# Patient Record
Sex: Male | Born: 1950 | Race: White | Hispanic: No | Marital: Single | Smoking: Former smoker
Health system: Southern US, Community
[De-identification: ages and names within clinical notes are randomized; demographics above are authoritative.]

---

## 2012-05-15 ENCOUNTER — Encounter (HOSPITAL_BASED_OUTPATIENT_CLINIC_OR_DEPARTMENT_OTHER): Payer: Self-pay | Admitting: *Deleted

## 2012-05-15 ENCOUNTER — Emergency Department (HOSPITAL_BASED_OUTPATIENT_CLINIC_OR_DEPARTMENT_OTHER): Payer: No Typology Code available for payment source

## 2012-05-15 ENCOUNTER — Emergency Department (HOSPITAL_BASED_OUTPATIENT_CLINIC_OR_DEPARTMENT_OTHER)
Admission: EM | Admit: 2012-05-15 | Discharge: 2012-05-15 | Disposition: A | Discharge status: Home / Self Care | Payer: No Typology Code available for payment source | Attending: Emergency Medicine | Admitting: Emergency Medicine

## 2012-05-15 DIAGNOSIS — J3489 Other specified disorders of nose and nasal sinuses: Secondary | ICD-10-CM | POA: Insufficient documentation

## 2012-05-15 DIAGNOSIS — M2550 Pain in unspecified joint: Secondary | ICD-10-CM | POA: Insufficient documentation

## 2012-05-15 DIAGNOSIS — R509 Fever, unspecified: Secondary | ICD-10-CM | POA: Insufficient documentation

## 2012-05-15 DIAGNOSIS — J159 Unspecified bacterial pneumonia: Secondary | ICD-10-CM | POA: Insufficient documentation

## 2012-05-15 DIAGNOSIS — IMO0001 Reserved for inherently not codable concepts without codable children: Secondary | ICD-10-CM | POA: Insufficient documentation

## 2012-05-15 DIAGNOSIS — J111 Influenza due to unidentified influenza virus with other respiratory manifestations: Secondary | ICD-10-CM

## 2012-05-15 DIAGNOSIS — R42 Dizziness and giddiness: Secondary | ICD-10-CM | POA: Insufficient documentation

## 2012-05-15 DIAGNOSIS — J189 Pneumonia, unspecified organism: Secondary | ICD-10-CM

## 2012-05-15 DIAGNOSIS — R5381 Other malaise: Secondary | ICD-10-CM | POA: Insufficient documentation

## 2012-05-15 DIAGNOSIS — R062 Wheezing: Secondary | ICD-10-CM | POA: Insufficient documentation

## 2012-05-15 DIAGNOSIS — J029 Acute pharyngitis, unspecified: Secondary | ICD-10-CM | POA: Insufficient documentation

## 2012-05-15 DIAGNOSIS — R112 Nausea with vomiting, unspecified: Secondary | ICD-10-CM | POA: Insufficient documentation

## 2012-05-15 DIAGNOSIS — R51 Headache: Secondary | ICD-10-CM | POA: Insufficient documentation

## 2012-05-15 MED ORDER — AZITHROMYCIN 250 MG PO TABS: ORAL_TABLET | ORAL | Status: DC

## 2012-05-15 NOTE — ED Notes (Signed)
Cough, chills onset Friday. Dizzy at times.

## 2012-05-15 NOTE — ED Provider Notes (Signed)
I  reviewed the resident's note and I agree with the findings and plan.      Nelia Shi, MD 05/15/12 [phone]

## 2012-05-15 NOTE — ED Provider Notes (Signed)
History     CSN: [id number]  Arrival date & time 05/15/12  1357   First MD Initiated Contact with Patient 05/15/12 1419      Chief Complaint  Patient presents with  . Cough    HPI Patient is a 62 yo M presenting to ED with 2 day history of cough of chills, getting progressively worse. He states his symptoms first started with diffuse body aches, then he developed cough, dizziness, fever, N/V. He did not have a flu shot this year. His co-worker was sick last week as well with similar symptoms. He has been drinking plenty of water and taking NSAIDs but nothing makes him feel better. He denies numbness, tingling or syncope.  History reviewed. No pertinent past medical history.  History reviewed. No pertinent past surgical history.  History reviewed. No pertinent family history.  History  Substance Use Topics  . Smoking status: Never Smoker   . Smokeless tobacco: Not on file  . Alcohol Use: Yes    Review of Systems  Constitutional: Positive for fever, chills, activity change and fatigue.  HENT: Positive for congestion and sore throat. Negative for neck pain and neck stiffness.   Eyes: Negative for visual disturbance.  Respiratory: Positive for cough and wheezing. Negative for shortness of breath.   Cardiovascular: Negative for chest pain.  Gastrointestinal: Positive for nausea and vomiting. Negative for abdominal pain.  Genitourinary: Negative for dysuria.  Musculoskeletal: Positive for myalgias and arthralgias.  Skin: Negative for rash.  Neurological: Positive for dizziness and headaches.  All other systems reviewed and are negative.    Allergies  Review of patient's allergies indicates no known allergies.  Home Medications   Current Outpatient Rx  Name  Route  Sig  Dispense  Refill  . AZITHROMYCIN 250 MG PO TABS      Take 2 tablets today, followed by one tablet per day for the following 4 days   6 each   0     BP 133/74  Pulse 112  Temp 99.3 F (37.4 C)  (Oral)  Resp 20  Ht 6\' 1"  (1.854 m)  Wt 185 lb (83.915 kg)  BMI 24.41 kg/m2  SpO2 96%  Physical Exam  Constitutional: He is oriented to person, place, and time. He appears well-developed and well-nourished. No distress.  HENT:  Head: Normocephalic and atraumatic.  Right Ear: External ear normal.  Left Ear: External ear normal.  Nose: Nose normal.  Mouth/Throat: Mucous membranes are dry. Posterior oropharyngeal edema and posterior oropharyngeal erythema present.  Eyes: Pupils are equal, round, and reactive to light.  Neck: Normal range of motion.  Pulmonary/Chest: Effort normal. He has wheezes. He has rales. He exhibits no tenderness.  Abdominal: Soft. There is no tenderness.  Musculoskeletal: Normal range of motion. He exhibits no edema.  Lymphadenopathy:    He has no cervical adenopathy.  Neurological: He is alert and oriented to person, place, and time. No cranial nerve deficit.  Skin: Skin is warm and dry. No rash noted.    ED Course  Procedures (including critical care time)  Labs Reviewed - No data to display Dg Chest 2 View  05/15/2012  *RADIOLOGY REPORT*  Clinical Data: Cough, chills.  CHEST - 2 VIEW  Comparison: None.  Findings: Lungs are hyperinflated.  There is patchy atelectasis , scarring, or infiltrate in the lateral basal segment right lower lobe.  Left lung clear.  Heart size normal.  No effusion.  Regional bones unremarkable.  IMPRESSION:  1.  Focal infiltrate, scarring  or atelectasis laterally at the right lung base.   Original Report Authenticated By: D. Andria Rhein, MD      1. Influenza   2. Community acquired pneumonia      MDM  62 yo M with flu-like illness  Patient with signs and symptoms of influenza. Will treat symptomatically with Tylenol and OTC cold medications. Discussed pros/cons of Tamiflu, which was not prescribed. He should rest and drink plenty of fluids. Also, his CXR showed haziness on right lung base and pneumonia cannot be excluded. Will  treated as community acquired with Z-pack.  Will give work note to return to work on 05/19/12. If not improved by then, he should return for re-evaluation.    Hilarie Fredrickson, MD 05/15/12 [phone]

## 2015-07-29 ENCOUNTER — Encounter (HOSPITAL_BASED_OUTPATIENT_CLINIC_OR_DEPARTMENT_OTHER): Payer: Self-pay

## 2015-07-29 ENCOUNTER — Observation Stay (HOSPITAL_COMMUNITY): Payer: No Typology Code available for payment source

## 2015-07-29 ENCOUNTER — Emergency Department (HOSPITAL_BASED_OUTPATIENT_CLINIC_OR_DEPARTMENT_OTHER): Payer: No Typology Code available for payment source

## 2015-07-29 ENCOUNTER — Observation Stay (HOSPITAL_BASED_OUTPATIENT_CLINIC_OR_DEPARTMENT_OTHER)
Admission: EM | Admit: 2015-07-29 | Discharge: 2015-07-31 | Disposition: A | Discharge status: Home / Self Care | Payer: No Typology Code available for payment source | Attending: Internal Medicine | Admitting: Internal Medicine

## 2015-07-29 DIAGNOSIS — F17220 Nicotine dependence, chewing tobacco, uncomplicated: Secondary | ICD-10-CM | POA: Insufficient documentation

## 2015-07-29 DIAGNOSIS — I639 Cerebral infarction, unspecified: Secondary | ICD-10-CM | POA: Diagnosis present

## 2015-07-29 DIAGNOSIS — Z79899 Other long term (current) drug therapy: Secondary | ICD-10-CM | POA: Insufficient documentation

## 2015-07-29 DIAGNOSIS — R29898 Other symptoms and signs involving the musculoskeletal system: Secondary | ICD-10-CM

## 2015-07-29 DIAGNOSIS — I10 Essential (primary) hypertension: Secondary | ICD-10-CM | POA: Insufficient documentation

## 2015-07-29 DIAGNOSIS — E785 Hyperlipidemia, unspecified: Secondary | ICD-10-CM | POA: Diagnosis not present

## 2015-07-29 DIAGNOSIS — G459 Transient cerebral ischemic attack, unspecified: Principal | ICD-10-CM | POA: Insufficient documentation

## 2015-07-29 DIAGNOSIS — I638 Other cerebral infarction: Secondary | ICD-10-CM

## 2015-07-29 DIAGNOSIS — Z7982 Long term (current) use of aspirin: Secondary | ICD-10-CM | POA: Insufficient documentation

## 2015-07-29 DIAGNOSIS — R55 Syncope and collapse: Secondary | ICD-10-CM | POA: Diagnosis not present

## 2015-07-29 LAB — URINALYSIS, ROUTINE W REFLEX MICROSCOPIC
Bilirubin Urine: NEGATIVE
GLUCOSE, UA: NEGATIVE mg/dL
Hgb urine dipstick: NEGATIVE
Ketones, ur: 15 mg/dL — AB
LEUKOCYTES UA: NEGATIVE
Nitrite: NEGATIVE
PH: 5 (ref 5.0–8.0)
PROTEIN: NEGATIVE mg/dL
SPECIFIC GRAVITY, URINE: 1.018 (ref 1.005–1.030)

## 2015-07-29 LAB — DIFFERENTIAL
BASOS PCT: 1 %
Basophils Absolute: 0 10*3/uL (ref 0.0–0.1)
Eosinophils Absolute: 0.2 10*3/uL (ref 0.0–0.7)
Eosinophils Relative: 2 %
LYMPHS PCT: 28 %
Lymphs Abs: 1.8 10*3/uL (ref 0.7–4.0)
MONO ABS: 0.4 10*3/uL (ref 0.1–1.0)
MONOS PCT: 6 %
NEUTROS ABS: 4.1 10*3/uL (ref 1.7–7.7)
Neutrophils Relative %: 63 %

## 2015-07-29 LAB — PROTIME-INR
INR: 1 (ref 0.00–1.49)
Prothrombin Time: 13.4 seconds (ref 11.6–15.2)

## 2015-07-29 LAB — RAPID URINE DRUG SCREEN, HOSP PERFORMED
Amphetamines: NOT DETECTED
Barbiturates: NOT DETECTED
Benzodiazepines: NOT DETECTED
COCAINE: NOT DETECTED
OPIATES: NOT DETECTED
Tetrahydrocannabinol: NOT DETECTED

## 2015-07-29 LAB — CBC
HEMATOCRIT: 44 % (ref 39.0–52.0)
Hemoglobin: 15.3 g/dL (ref 13.0–17.0)
MCH: 29.4 pg (ref 26.0–34.0)
MCHC: 34.8 g/dL (ref 30.0–36.0)
MCV: 84.5 fL (ref 78.0–100.0)
Platelets: 205 10*3/uL (ref 150–400)
RBC: 5.21 MIL/uL (ref 4.22–5.81)
RDW: 13.2 % (ref 11.5–15.5)
WBC: 6.6 10*3/uL (ref 4.0–10.5)

## 2015-07-29 LAB — ETHANOL

## 2015-07-29 LAB — COMPREHENSIVE METABOLIC PANEL
ALK PHOS: 51 U/L (ref 38–126)
ALT: 37 U/L (ref 17–63)
AST: 35 U/L (ref 15–41)
Albumin: 4.5 g/dL (ref 3.5–5.0)
Anion gap: 9 (ref 5–15)
BUN: 13 mg/dL (ref 6–20)
CALCIUM: 9.5 mg/dL (ref 8.9–10.3)
CO2: 25 mmol/L (ref 22–32)
CREATININE: 1.05 mg/dL (ref 0.61–1.24)
Chloride: 103 mmol/L (ref 101–111)
Glucose, Bld: 111 mg/dL — ABNORMAL HIGH (ref 65–99)
Potassium: 4.2 mmol/L (ref 3.5–5.1)
Sodium: 137 mmol/L (ref 135–145)
Total Bilirubin: 0.5 mg/dL (ref 0.3–1.2)
Total Protein: 8.3 g/dL — ABNORMAL HIGH (ref 6.5–8.1)

## 2015-07-29 LAB — APTT: aPTT: 31 seconds (ref 24–37)

## 2015-07-29 LAB — TROPONIN I: Troponin I: <0.03 ng/mL (ref <0.031)

## 2015-07-29 MED ORDER — SENNOSIDES-DOCUSATE SODIUM 8.6-50 MG PO TABS
1.0000 | ORAL_TABLET | Freq: Every evening | ORAL | Status: DC | PRN
Start: 2015-07-29 — End: 2015-07-31

## 2015-07-29 MED ORDER — ASPIRIN 325 MG PO TABS
325.0000 mg | ORAL_TABLET | Freq: Every day | ORAL | Status: DC
  Administered 2015-07-30 – 2015-07-31 (×2): 325 mg via ORAL
  Filled 2015-07-29 (×2): qty 1

## 2015-07-29 MED ORDER — ENOXAPARIN SODIUM 40 MG/0.4ML ~~LOC~~ SOLN
40.0000 mg | SUBCUTANEOUS | Status: DC
  Administered 2015-07-29 – 2015-07-30 (×2): 40 mg via SUBCUTANEOUS
  Filled 2015-07-29: qty 0.4

## 2015-07-29 MED ORDER — ASPIRIN 81 MG PO CHEW
324.0000 mg | CHEWABLE_TABLET | Freq: Once | ORAL | Status: AC
  Administered 2015-07-29: 324 mg via ORAL
  Filled 2015-07-29: qty 4

## 2015-07-29 MED ORDER — ATORVASTATIN CALCIUM 10 MG PO TABS
10.0000 mg | ORAL_TABLET | Freq: Every day | ORAL | Status: DC
  Administered 2015-07-30: 10 mg via ORAL
  Filled 2015-07-29: qty 1

## 2015-07-29 MED ORDER — STROKE: EARLY STAGES OF RECOVERY BOOK
Freq: Once | Status: AC
  Administered 2015-07-29: 20:00:00

## 2015-07-29 NOTE — ED Notes (Signed)
Pt transferred to [place] via [facility] 

## 2015-07-29 NOTE — ED Notes (Signed)
Dr Karma GanjaLinker aware of pt.

## 2015-07-29 NOTE — ED Notes (Signed)
[facility] is aware of bed ready on patient for [id number]

## 2015-07-29 NOTE — ED Provider Notes (Signed)
CSN: [id number]     Arrival date & time 07/29/15  1553 History  By signing my name below, I, Linus Galas, attest that this documentation has been prepared under the direction and in the presence of No att. providers found. Electronically Signed: Linus Galas, ED Scribe. 08/01/2015. 5:30 PM.   Chief Complaint  Patient presents with  . Loss of Consciousness   The history is provided by the patient. No language interpreter was used.   HPI Comments: WARD BOISSONNEAULT is a 65 y.o. male who presents to the Emergency Department with no pertinent PMHx complaining of a witnessed sudden syncopal episode that occurred last night approx 9pm. Pt states when he stood up from eating last night, he had a sudden syncopal episode. He regained his consciousness several minutes after his episode. Prior to his episode, he states he felt fine with no complaints. Pt states when he woke he had left arm numbness and left leg weakness. He reports he has had intermittent arm numbness due to work but his current numbness is more severe. Pt denies left arm weakness s/p syncopal episode. Pts left leg is back to baseline. Pt denies any fevers, chills, CP, SOB, N/V/D, visual disturbances, speech changes, or any other symptoms at this time.   History reviewed. No pertinent past medical history. History reviewed. No pertinent past surgical history. No family history on file. Social History  Substance Use Topics  . Smoking status: Former Games developer  . Smokeless tobacco: Current User    Types: Chew  . Alcohol Use: Yes    Review of Systems  Constitutional: Negative for fever and chills.  Eyes: Negative for visual disturbance.  Respiratory: Negative for shortness of breath.   Cardiovascular: Negative for chest pain.  Gastrointestinal: Negative for nausea, vomiting and diarrhea.  Neurological: Positive for syncope, weakness and numbness. Negative for speech difficulty.  All other systems reviewed and are  negative.  Allergies  Review of patient's allergies indicates no known allergies.  Home Medications   Prior to Admission medications   Medication Sig Start Date End Date Taking? Authorizing Provider  aspirin 325 MG tablet Take 1 tablet (325 mg total) by mouth daily. 07/31/15   Kathlen Mody, MD  atorvastatin (LIPITOR) 10 MG tablet Take 1 tablet (10 mg total) by mouth daily at 6 PM. 07/31/15   Kathlen Mody, MD   BP 132/83 mmHg  Pulse 70  Temp(Src) 97.8 F (36.6 C) (Oral)  Resp 20  Ht  (1.854 m)  Wt 178 lb 3.2 oz (80.831 kg)  BMI 23.52 kg/m2  SpO2 96%  Vitals reviewed Physical Exam  Physical Examination: General appearance - alert, well appearing, and in no distress Mental status - alert, oriented to person, place, and time Eyes - pupils equal and reactive, extraocular eye movements intact Mouth - mucous membranes moist, pharynx normal without lesions Neck - supple, no significant adenopathy Chest - clear to auscultation, no wheezes, rales or rhonchi, symmetric air entry Heart - normal rate, regular rhythm, normal S1, S2, no murmurs, rubs, clicks or gallops Abdomen - soft, nontender, nondistended, no masses or organomegaly Neurological - alert, oriented x 3, cranial nerves 2-12 tested and intact, strength 5/5 in extremities x 4, sensation to light touch decreased over left forearm otherwise sensation intact Extremities - peripheral pulses normal, no pedal edema, no clubbing or cyanosis Skin - normal coloration and turgor, no rashes, no suspicious skin lesions noted  ED Course  Procedures   DIAGNOSTIC STUDIES: Oxygen Saturation is 98% on room  air, normal by my interpretation.    COORDINATION OF CARE: 5:18 PM Will order CT head, blood work, urinalysis, and drug screen. Discussed treatment plan with pt at bedside and pt agreed to plan.   Labs Review Labs Reviewed  COMPREHENSIVE METABOLIC PANEL - Abnormal; Notable for the following:    Glucose, Bld 111 (*)    Total  Protein 8.3 (*)    All other components within normal limits  URINALYSIS, ROUTINE W REFLEX MICROSCOPIC (NOT AT [facility]) - Abnormal; Notable for the following:    Ketones, ur 15 (*)    All other components within normal limits  HEMOGLOBIN A1C - Abnormal; Notable for the following:    Hgb A1c MFr Bld 6.1 (*)    All other components within normal limits  ETHANOL  PROTIME-INR  APTT  CBC  DIFFERENTIAL  URINE RAPID DRUG SCREEN, HOSP PERFORMED  TROPONIN I  LIPID PANEL    Imaging Review No results found. I have personally reviewed and evaluated these images and lab results as part of my medical decision-making.   EKG Interpretation   Date/Time:  Monday July 29 2015 16:11:53 EDT Ventricular Rate:  88 PR Interval:  180 QRS Duration: 93 QT Interval:  379 QTC Calculation: 458 R Axis:   -6 Text Interpretation:  Sinus rhythm Abnormal R-wave progression, early  transition nonspecific st abnormality in inferior leads No old tracing to  compare Reconfirmed by [facility]  MD, Wilmer Berryhill [phone]([id number]) on 07/29/2015 5:33:33 PM      MDM   Final diagnoses:  TIA (transient ischemic attack)  Left leg weakness   Pt presenting with c/o left arm and leg weakness after syncopal episode- symptoms began almost 24 hours ago so not a TPA candidate.  Stroke workup negative thus far in the ED.     5:50 PM d/w Dr. Cena BentonVega, [facility]- pt to be admitted to a telemetry bed.  D/w Dr. Milbert CoulterNannigan- neurology-[facility].    I personally performed the services described in this documentation, which was scribed in my presence. The recorded information has been reviewed and is accurate.    Jerelyn ScottMartha Linker, MD 08/01/15 2039

## 2015-07-29 NOTE — ED Notes (Signed)
Pt states syncopal episode happened last pm at 2100. Pt has had intermittant numbness in left arm intermittantly x several months. C/o dizziness so bad this am that he missed work and that is unsual for him

## 2015-07-29 NOTE — ED Notes (Signed)
Report given to JC RN with [facility]. 

## 2015-07-29 NOTE — ED Notes (Signed)
Code was cancelled per Dr. Karma GanjaLinker

## 2015-07-29 NOTE — ED Notes (Signed)
[facility].

## 2015-07-29 NOTE — ED Notes (Signed)
Pt states he had worked all day was seated in [employer] chair-syncopal episode-states he had numbness to left arm and difficulty ambulating with pain to left leg-NAD-steady gait

## 2015-07-29 NOTE — ED Notes (Signed)
Pt on heart monitor 

## 2015-07-29 NOTE — H&P (Signed)
[facility] Admission History and Physical       AKIVA BRASSFIELD ZOX:[id number] DOB: [birth year] DOA: 07/29/2015  Referring physician: EDP PCP: No primary care provider on file.  Specialists:   Chief Complaint: Passed Out and had Left sided Weakness  HPI: Corey Fisher is a 65 y.o. male with no previous medical history who had an episode of passing out last night which was witnessed by his mother, and when he awakened he had weakness of his left side and was unable to use his left leg.  His leg returned to normal, but the numbness in his Left arm remained.  He denies any headache, or chest pain or dizziness.   He was evaluated at the [facility] ED, and a Ct scan of the head was performed and was negative for acute findings.  He was transferred to [facility] for further evaluation and a Neurology consultation.     Review of Systems: Constitutional: No Weight Loss, No Weight Gain, Night Sweats, Fevers, Chills, Dizziness, Light Headedness, Fatigue, or Generalized Weakness HEENT: No Headaches, Difficulty Swallowing,Tooth/Dental Problems,Sore Throat,  No Sneezing, Rhinitis, Ear Ache, Nasal Congestion, or Post Nasal Drip,  Cardio-vascular:  No Chest pain, Orthopnea, PND, Edema in Lower Extremities, Anasarca, Dizziness, Palpitations  Resp: No Dyspnea, No DOE, No Productive Cough, No Non-Productive Cough, No Hemoptysis, No Wheezing.    GI: No Heartburn, Indigestion, Abdominal Pain, Nausea, Vomiting, Diarrhea, Constipation, Hematemesis, Hematochezia, Melena, Change in Bowel Habits,  Loss of Appetite  GU: No Dysuria, No Change in Color of Urine, No Urgency or Urinary Frequency, No Flank pain.  Musculoskeletal: No Joint Pain or Swelling, No Decreased Range of Motion, No Back Pain.  Neurologic: +Syncope, No Seizures, Muscle Weakness, +Paresthesia Left Arm, Vision Disturbance or Loss, No Diplopia, No Vertigo, No Difficulty Walking,  Skin: No Rash or Lesions. Psych: No Change in Mood or Affect, No  Depression or Anxiety, No Memory loss, No Confusion, or Hallucinations   History reviewed. No pertinent past medical history.   History reviewed. No pertinent past surgical history.    Prior to Admission medications   Not on File     No Known Allergies  Social History:   reports that he has quit smoking. His smokeless tobacco use includes Chew. He reports that he drinks alcohol. He reports that he does not use illicit drugs.    No family history on file.     Physical Exam:   GEN:  Pleasant Well Nourished and Well Developed  65 y.o. Caucasian male examined and in no acute distress; cooperative with exam Filed Vitals:   07/29/15 1745 07/29/15 1830 07/29/15 1914 07/29/15 2015  BP: 158/96 143/99 149/101 149/94  Pulse: 78 81 81 75  Temp:   98.4 F (36.9 C) 98 F (36.7 C)  TempSrc:   Oral Oral  Resp: Height:     (1.854 m)  Weight:    80.831 kg (178 lb 3.2 oz)  SpO2: 98% 97% 97% 97%   Blood pressure 149/94, pulse 75, temperature 98 F (36.7 C), temperature source Oral, resp. rate 18, height  (1.854 m), weight 80.831 kg (178 lb 3.2 oz), SpO2 97 %. PSYCH: He is alert and oriented x4; does not appear anxious does not appear depressed; affect is normal HEENT: Normocephalic and Atraumatic, Mucous membranes pink; PERRLA; EOM intact; Fundi:  Benign;  No scleral icterus, Nares: Patent, Oropharynx: Clear, Fair Dentition,    Neck:  FROM, No Cervical Lymphadenopathy nor Thyromegaly or  Carotid Bruit; No JVD; Breasts:: Not examined CHEST WALL: No tenderness CHEST: Normal respiration, clear to auscultation bilaterally HEART: Regular rate and rhythm; no murmurs rubs or gallops BACK: No kyphosis or scoliosis; No CVA tenderness ABDOMEN: Positive Bowel Sounds, Scaphoid, Obese, Soft Non-Tender, No Rebound or Guarding; No Masses, No Organomegaly. Rectal Exam: Not done EXTREMITIES: No Cyanosis, Clubbing, or Edema; No Ulcerations. Genitalia: not examined PULSES: 2+ and  symmetric SKIN: Normal hydration no rash or ulceration  CNS:  Alert and Oriented x 4, No Focal Deficits Mental Status:  Alert, Oriented, Thought Content Appropriate. Speech Fluent without evidence of Aphasia. Able to follow 3 step commands without difficulty.  In No obvious pain.   Cranial Nerves:  II: Discs flat bilaterally; Visual fields Intact, Pupils equal and reactive.    III,IV, VI: Extra-ocular motions intact bilaterally    V,VII: smile symmetric, facial light touch sensation normal bilaterally    VIII: hearing intact bilaterally    IX,X: gag reflex present    XI: bilateral shoulder shrug    XII: midline tongue extension   Motor:  Right:  Upper extremity 5/5     Left:  Upper extremity 5/5     Right:  Lower extremity 5/5    Left:  Lower extremity 5/5     Tone and Bulk:  normal tone throughout; no atrophy noted   Sensory:  Pinprick and light touch intact throughout, bilaterally   Deep Tendon Reflexes: 2+ and symmetric throughout   Plantars/ Babinski:  Right:  normal Left: normal    Cerebellar:  Finger to nose without difficulty.   Gait: deferred    Vascular: pulses palpable throughout    Labs on Admission:  Basic Metabolic Panel:  Recent Labs Lab 07/29/15 1628  NA 137  K 4.2  CL 103  CO2 25  GLUCOSE 111*  BUN 13  CREATININE 1.05  CALCIUM 9.5   Liver Function Tests:  Recent Labs Lab 07/29/15 1628  AST 35  ALT 37  ALKPHOS 51  BILITOT 0.5  PROT 8.3*  ALBUMIN 4.5   No results for input(s): LIPASE, AMYLASE in the last 168 hours. No results for input(s): AMMONIA in the last 168 hours. CBC:  Recent Labs Lab 07/29/15 1628  WBC 6.6  NEUTROABS 4.1  HGB 15.3  HCT 44.0  MCV 84.5  PLT 205   Cardiac Enzymes:  Recent Labs Lab 07/29/15 1628  TROPONINI <0.03    BNP (last 3 results) No results for input(s): BNP in the last 8760 hours.  ProBNP (last 3 results) No results for input(s): PROBNP in the last 8760 hours.  CBG: No results for  input(s): GLUCAP in the last 168 hours.  Radiological Exams on Admission: Ct Head Wo Contrast  07/29/2015  CLINICAL DATA:  Syncopal episode yesterday.  Right arm numbness. EXAM: CT HEAD WITHOUT CONTRAST TECHNIQUE: Contiguous axial images were obtained from the base of the skull through the vertex without intravenous contrast. COMPARISON:  None. FINDINGS: Sinuses/Soft tissues: Clear paranasal sinuses and mastoid air cells. Intracranial: Mild low density in the periventricular white matter likely related to small vessel disease. Mild cerebral and cerebellar atrophy. No mass lesion, hemorrhage, hydrocephalus, acute infarct, intra-axial, or extra-axial fluid collection. IMPRESSION: 1.  No acute intracranial abnormality. 2.  Cerebral/cerebellar atrophy and small vessel ischemic change. Electronically Signed   By: Jeronimo GreavesKyle  Talbot M.D.   On: 07/29/2015 16:47     EKG: Independently reviewed.    Assessment/Plan:   65 y.o. male with  Principal Problem:  Stroke (HCC)/TIA (transient ischemic attack)   CVA/TIA Workup   Cardiac Monitoring   Neuro Checks   MRI/MRA Brain ordered   Carotid US, and 2D ECHO in AM   Check fasting Lipids, and HbA1C   ASA Rx   Neurology consulted  Active Problems:    Syncope and collapse   TIA Workup    DVT Prophylaxis   Lovenox    Code Status:     FULL CODE      Family Communication:  No Family Present    Disposition Plan:   Observation Status        Time spent: 26 Minutes      Ron Parker [facility] Pager [phone]   If 7AM -7PM Please Contact the Day Rounding Team MD for [facility]  If 7PM-7AM, Please Contact Night-Floor Coverage  [email or web address] Password [facility] 07/29/2015, 8:37 PM     ADDENDUM:   Patient was seen and examined on 07/29/2015

## 2015-07-30 ENCOUNTER — Observation Stay (HOSPITAL_BASED_OUTPATIENT_CLINIC_OR_DEPARTMENT_OTHER): Payer: No Typology Code available for payment source

## 2015-07-30 ENCOUNTER — Observation Stay (HOSPITAL_COMMUNITY)
Admit: 2015-07-30 | Discharge: 2015-07-30 | Disposition: A | Discharge status: Home / Self Care | Payer: No Typology Code available for payment source | Attending: Neurology | Admitting: Neurology

## 2015-07-30 DIAGNOSIS — G459 Transient cerebral ischemic attack, unspecified: Secondary | ICD-10-CM | POA: Diagnosis not present

## 2015-07-30 DIAGNOSIS — R55 Syncope and collapse: Secondary | ICD-10-CM

## 2015-07-30 DIAGNOSIS — I638 Other cerebral infarction: Secondary | ICD-10-CM | POA: Diagnosis not present

## 2015-07-30 LAB — LIPID PANEL
Cholesterol: 142 mg/dL (ref 0–200)
HDL: 48 mg/dL (ref >40)
LDL CALC: 76 mg/dL (ref 0–99)
Total CHOL/HDL Ratio: 3 RATIO
Triglycerides: 88 mg/dL (ref <150)
VLDL: 18 mg/dL (ref 0–40)

## 2015-07-30 LAB — ECHOCARDIOGRAM COMPLETE
Height: 73 in
WEIGHTICAEL: 2851.2 [oz_av]

## 2015-07-30 NOTE — Evaluation (Signed)
Physical Therapy Evaluation and Discharge Patient Details Name: Corey Fisher MRN: [id number] DOB: 11/28/1950 Today's Date: 07/30/2015   History of Present Illness  Corey Fisher is a 65 y.o. male with no previous medical history who had an episode of passing out last night which was witnessed by his mother, and when he awakened he had weakness of his left side and was unable to use his left leg. His leg returned to normal, but the numbness in his Left arm remained. He denies any headache, or chest pain or dizziness. He was evaluated at the [facility] ED, and a Ct scan of the head was performed and was negative for acute findings. He was transferred to [facility] for further evaluation and a Neurology consultation  Clinical Impression  Pt admitted with above diagnosis.  Pt functioning at baseline. Pt with no further acute PT needs at this time. PT signing OFF. Please re-consult if needed in future.     Follow Up Recommendations No PT follow up    Equipment Recommendations  None recommended by PT    Recommendations for Other Services       Precautions / Restrictions Precautions Precautions: None Precaution Comments: pt is illiterate Restrictions Weight Bearing Restrictions: No      Mobility  Bed Mobility Overal bed mobility: Modified Independent             General bed mobility comments: HOB elevated  Transfers Overall transfer level: Modified independent Equipment used: None             General transfer comment: safe technique  Ambulation/Gait Ambulation/Gait assistance: Modified independent (Device/Increase time) Ambulation Distance (Feet): 200 Feet Assistive device: None Gait Pattern/deviations: WFL(Within Functional Limits) Gait velocity: wfl Gait velocity interpretation: at or above normal speed for age/gender General Gait Details: no episodes of LOB  Stairs Stairs: Yes Stairs assistance: Supervision Stair Management: Two rails Number of Stairs:  10 General stair comments: no episodes of lob  Wheelchair Mobility    Modified Rankin (Stroke Patients Only) Modified Rankin (Stroke Patients Only) Pre-Morbid Rankin Score: No symptoms Modified Rankin: No significant disability     Balance Overall balance assessment: Needs assistance                             High Level Balance Comments: pt able to tandem amb x 10' with min guard, able to walk backwards with supervision without LOB Standardized Balance Assessment Standardized Balance Assessment : Dynamic Gait Index   Dynamic Gait Index Level Surface: Normal Change in Gait Speed: Normal Gait with Horizontal Head Turns: Normal Gait with Vertical Head Turns: Normal Gait and Pivot Turn: Normal Step Over Obstacle: Normal Step Around Obstacles: Normal Steps: Mild Impairment Total Score: 23       Pertinent Vitals/Pain Pain Assessment: No/denies pain    Home Living Family/patient expects to be discharged to:: Private residence Living Arrangements: Parent (elderly mother) Available Help at Discharge: Family;Available PRN/intermittently Type of Home: House Home Access: Stairs to enter   [employer] of Steps: 1 Home Layout: One level Home Equipment: None      Prior Function Level of Independence: Independent         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   Dominant Hand: Right    Extremity/Trunk Assessment   Upper Extremity Assessment: Overall WFL for tasks assessed           Lower Extremity Assessment: Overall WFL for tasks  assessed      Cervical / Trunk Assessment: Normal  Communication   Communication: No difficulties  Cognition Arousal/Alertness: Awake/alert Behavior During Therapy: WFL for tasks assessed/performed Overall Cognitive Status: Within Functional Limits for tasks assessed                      General Comments General comments (skin integrity, edema, etc.): pt is illiterate    Exercises         Assessment/Plan    PT Assessment Patent does not need any further PT services  PT Diagnosis Generalized weakness   PT Problem List    PT Treatment Interventions     PT Goals (Current goals can be found in the Care Plan section) Acute Rehab PT Goals Patient Stated Goal: go back to work tomorrow PT Goal Formulation: All assessment and education complete, DC therapy    Frequency     Barriers to discharge        Co-evaluation               End of Session Equipment Utilized During Treatment: Gait belt Activity Tolerance: Patient tolerated treatment well Patient left: in chair;with call bell/phone within reach Nurse Communication: Mobility status    Functional Assessment Tool Used: clinical judgement Functional Limitation: Mobility: Walking and moving around Mobility: Walking and Moving Around Current Status [phone](G8978): 0 percent impaired, limited or restricted Mobility: Walking and Moving Around Goal Status [phone](G8979): 0 percent impaired, limited or restricted Mobility: Walking and Moving Around Discharge Status [phone](G8980): 0 percent impaired, limited or restricted    Time: [id number] PT Time Calculation (min) (ACUTE ONLY): 20 min   Charges:   PT Evaluation $PT Eval Low Complexity: 1 Procedure     PT G Codes:   PT G-Codes **NOT FOR INPATIENT CLASS** Functional Assessment Tool Used: clinical judgement Functional Limitation: Mobility: Walking and moving around Mobility: Walking and Moving Around Current Status ([id number](G8978): 0 percent impaired, limited or restricted Mobility: Walking and Moving Around Goal Status ([id number](G8979): 0 percent impaired, limited or restricted Mobility: Walking and Moving Around Discharge Status [phone](G8980): 0 percent impaired, limited or restricted    Corey Fisher, Corey Fisher 07/30/2015, 2:25 PM   Lewis ShockAshly Vanessa Kampf, PT, DPT Pager #: [phone] Office #: [phone]
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
## 2015-07-30 NOTE — Care Management Note (Signed)
Case Management Note  Patient Details  Name: Autumn PattyHerman V Conklin MRN: [id number] Date of Birth: 04/09/1951  Subjective/Objective:                    Action/Plan: Patient presented to the ED after a witness episode of passing out followed by left sided weakness.  Lives at home alone.  Will follow for discharge needs pending patient's progress.  Expected Discharge Date:  07/30/15               Expected Discharge Plan:     In-House Referral:     Discharge planning Services     Post Acute Care Choice:    Choice offered to:     DME Arranged:    DME Agency:     HH Arranged:    HH Agency:     Status of Service:  In process, will continue to follow  Medicare Important Message Given:    Date Medicare IM Given:    Medicare IM give by:    Date Additional Medicare IM Given:    Additional Medicare Important Message give by:     If discussed at Long Length of Stay Meetings, dates discussed:    Additional Comments:  Anda KraftRobarge, Lorice Lafave C, RN 07/30/2015, 11:19 AM [phone]

## 2015-07-30 NOTE — Consult Note (Addendum)
Neurology Consultation Reason for Consult: left arm and leg weakness Referring Physician: IM Attending  CC: as above  History is obtained from patient and chart  HPI: Corey Fisher is a 65 y.o. male with no past medical hx he knows of.  He does not take any meds.  He tells me that Sunday night sometime before dinner he was walking and without warning passed out.  He took approx 30 seconds to wake up on the floor. No tongue biting or urinary incontinence. No shaking or jerking.  No hx of seizures. No prior dx of syncope.  Around 9pm Sunday night started to have left arm numbness and left leg weakness.  He believes the left leg weakness is gone but the left arm numbness persists to some extent.  Neurology is consulted for these symptoms.  MRI brain is negative. MRA brain is also negative.  He has had similar sx once within last year.  ROS was otherwise negative    ROS: A 14 point ROS was performed and is negative except as noted in the HPI.  History reviewed. No pertinent past medical history.  No family history on file.  Social History:  reports that he has quit smoking. His smokeless tobacco use includes Chew. He reports that he drinks alcohol. He reports that he does not use illicit drugs.  Exam: Current vital signs: BP 149/94 mmHg  Pulse 75  Temp(Src) 98 F (36.7 C) (Oral)  Resp 18  Ht  (1.854 m)  Wt 80.831 kg (178 lb 3.2 oz)  BMI 23.52 kg/m2  SpO2 97% Vital signs in last 24 hours: Temp:  [98 F (36.7 C)-98.4 F (36.9 C)] 98 F (36.7 C) (04/17 2015) Pulse Rate:  [75-99] 75 (04/17 2015) Resp:  [12-20] 18 (04/17 2015) BP: (142-160)/(94-112) 149/94 mmHg (04/17 2015) SpO2:  [96 %-98 %] 97 % (04/17 2015) Weight:  [80.831 kg (178 lb 3.2 oz)-81.647 kg (180 lb)] 80.831 kg (178 lb 3.2 oz) (04/17 2015)   Physical Exam  Constitutional: Appears well-developed and well-nourished.  Psych: Affect appropriate to situation Eyes: No scleral injection HENT: No OP  obstrucion Head: Normocephalic.  Cardiovascular: Normal rate and regular rhythm.  Respiratory: Effort normal and breath sounds normal to anterior ascultation GI: Soft.  No distension. There is no tenderness.  Skin: WDI  Neuro: Mental Status: Patient is awake, alert, oriented to person, place, month, year, and situation Patient is able to give a clear and coherent history No signs of aphasia or neglect Cranial Nerves: II: Visual Fields are full. Pupils are equal, round, and reactive to light.  III,IV, VI: EOMI without ptosis or diploplia.  V: Facial sensation is symmetric to temperature VII: Facial movement is symmetric.  VIII: hearing is intact to voice X: Uvula elevates symmetrically XI: Shoulder shrug is symmetric. XII: tongue is midline without atrophy or fasciculations.  Motor: Tone is normal. Bulk is normal. 5/5 strength was present in all four extremities. Sensory: Sensation is symmetric to light touch and temperature in the arms and legs Deep Tendon Reflexes: 2+ and symmetric in the biceps and patellae. Plantars: Toes are downgoing bilaterally. Cerebellar: FNF and HKS are intact bilaterally    I have reviewed labs in epic and the results pertinent to this consultation are: normal labs  I have reviewed the images obtained:MRI and MRA and agree that they are normal  Impression: unusual symptoms.  Even though he reports no medical hx he appears to have multiple stroke risk factors.  He is hypertensive  here.  His age and smoking hx certaily are risk factors and   Recommendations: 1) Recommend TIA workup-  Echo and carotid dopplers as well as lipid panel are pending.  This overlaps in large part with an equally necessary syncope workup.  Start asa 325 and continue pt on this medication. conisder otpt f/u for HTN and eval for DM as well.  Stroke service will follow pt after tomorrow. Will order above tests.  Consider MRI cervical spine if no obvious cause found

## 2015-07-30 NOTE — Progress Notes (Signed)
VASCULAR LAB PRELIMINARY  PRELIMINARY  PRELIMINARY  PRELIMINARY  Carotid duplex completed.     Bilateral:  No evidence of ICA stenosis.  Vertebral artery flow is antegrade.     Jenetta Logesami Debe Anfinson, RVT, RDMS 07/30/2015, 10:47 AM

## 2015-07-30 NOTE — Progress Notes (Signed)
EEG completed, results pending. 

## 2015-07-30 NOTE — Progress Notes (Signed)
PROGRESS NOTE    Corey Fisher  ZOX:[id number] DOB: 11/20/1950 DOA: 07/29/2015 PCP: No primary care provider on file.     Brief Narrative:  Corey Fisher is a 65 y.o. male with no past medical hx he knows of presents with a syncopal episode.  He was admitted for evaluation of TIA/ stroke eval.   Assessment & Plan:   Principal Problem:   Stroke [facility](HCC) Active Problems:   TIA (transient ischemic attack)   Syncope and collapse   Syncope : Admitted to telemetry for evaluation of CVA.  MRI is negative for acute stroke.  EEg ordered and pending.  MRA unremarkable.  hgab1c is pending.  LDL is 76, recommended cardiology evaluation, pt wanted to get the eval done as outpatient.    Hypertension: Controlled.    Hyperlipidemia Continue lipitor.   Back pain : Get lumbar x rays.    DVT prophylaxis: (Lovenox Code Status: (Full Family Communication: discussed with sister at bedside.  Disposition Plan: d/c in am after EEG results.    Consultants:  Neurology.    Procedures:  echocardiogram.   EEG  Carotid duplex.    Antimicrobials: none    Subjective: Wants to go home.   Objective: Filed Vitals:   07/30/15 0400 07/30/15 0600 07/30/15 0800 07/30/15 1856  BP: 128/88 142/90 140/82 137/86  Pulse: 70 76 87 72  Temp: 98.6 F (37 C) 98.6 F (37 C)  98.4 F (36.9 C)  TempSrc: Oral Oral  Oral  Resp: 17 18 17 16   Height:      Weight:      SpO2: 98% 100% 96% 99%    Intake/Output Summary (Last 24 hours) at 07/30/15 1910 Last data filed at 07/30/15 1148  Gross per 24 hour  Intake    120 ml  Output   1120 ml  Net  -1000 ml   Filed Weights   07/29/15 1602 07/29/15 2015  Weight: 81.647 kg (180 lb) 80.831 kg (178 lb 3.2 oz)    Examination:  General exam: Appears calm and comfortable  Respiratory system: Clear to auscultation. Respiratory effort normal. Cardiovascular system: S1 & S2 heard, RRR. No JVD, murmurs, rubs, gallops or clicks. No pedal  edema. Gastrointestinal system: Abdomen is nondistended, soft and nontender. No organomegaly or masses felt. Normal bowel sounds heard. Central nervous system: Alert and oriented. No focal neurological deficits. Extremities: Symmetric 5 x 5 power. Skin: No rashes, lesions or ulcers Psychiatry: Judgement and insight appear normal. Mood & affect appropriate.     Data Reviewed: I have personally reviewed following labs and imaging studies  CBC:  Recent Labs Lab 07/29/15 1628  WBC 6.6  NEUTROABS 4.1  HGB 15.3  HCT 44.0  MCV 84.5  PLT 205   Basic Metabolic Panel:  Recent Labs Lab 07/29/15 1628  NA 137  K 4.2  CL 103  CO2 25  GLUCOSE 111*  BUN 13  CREATININE 1.05  CALCIUM 9.5   GFR: Estimated Creatinine Clearance: 79.3 mL/min (by C-G formula based on Cr of 1.05). Liver Function Tests:  Recent Labs Lab 07/29/15 1628  AST 35  ALT 37  ALKPHOS 51  BILITOT 0.5  PROT 8.3*  ALBUMIN 4.5   No results for input(s): LIPASE, AMYLASE in the last 168 hours. No results for input(s): AMMONIA in the last 168 hours. Coagulation Profile:  Recent Labs Lab 07/29/15 1628  INR 1.00   Cardiac Enzymes:  Recent Labs Lab 07/29/15 1628  TROPONINI <0.03   BNP (last 3 results)  No results for input(s): PROBNP in the last 8760 hours. HbA1C: No results for input(s): HGBA1C in the last 72 hours. CBG: No results for input(s): GLUCAP in the last 168 hours. Lipid Profile:  Recent Labs  07/30/15 0654  CHOL 142  HDL 48  LDLCALC 76  TRIG 88  CHOLHDL 3.0   Thyroid Function Tests: No results for input(s): TSH, T4TOTAL, FREET4, T3FREE, THYROIDAB in the last 72 hours. Anemia Panel: No results for input(s): VITAMINB12, FOLATE, FERRITIN, TIBC, IRON, RETICCTPCT in the last 72 hours. Urine analysis:    Component Value Date/Time   COLORURINE YELLOW 07/29/2015 2150   APPEARANCEUR CLEAR 07/29/2015 2150   LABSPEC 1.018 07/29/2015 2150   PHURINE 5.0 07/29/2015 2150   GLUCOSEU  NEGATIVE 07/29/2015 2150   HGBUR NEGATIVE 07/29/2015 2150   BILIRUBINUR NEGATIVE 07/29/2015 2150   KETONESUR 15* 07/29/2015 2150   PROTEINUR NEGATIVE 07/29/2015 2150   NITRITE NEGATIVE 07/29/2015 2150   LEUKOCYTESUR NEGATIVE 07/29/2015 2150   Sepsis Labs: (procalcitonin:4,lacticidven:4)  )No results found for this or any previous visit (from the past 240 hour(s)).       Radiology Studies: Ct Head Wo Contrast  07/29/2015  CLINICAL DATA:  Syncopal episode yesterday.  Right arm numbness. EXAM: CT HEAD WITHOUT CONTRAST TECHNIQUE: Contiguous axial images were obtained from the base of the skull through the vertex without intravenous contrast. COMPARISON:  None. FINDINGS: Sinuses/Soft tissues: Clear paranasal sinuses and mastoid air cells. Intracranial: Mild low density in the periventricular white matter likely related to small vessel disease. Mild cerebral and cerebellar atrophy. No mass lesion, hemorrhage, hydrocephalus, acute infarct, intra-axial, or extra-axial fluid collection. IMPRESSION: 1.  No acute intracranial abnormality. 2.  Cerebral/cerebellar atrophy and small vessel ischemic change. Electronically Signed   By: Jeronimo Greaves M.D.   On: 07/29/2015 16:47   Mr Brain Wo Contrast  07/30/2015  CLINICAL DATA:  Initial evaluation for acute syncope, left-sided weakness upon awakening. EXAM: MRI HEAD WITHOUT CONTRAST MRA HEAD WITHOUT CONTRAST TECHNIQUE: Multiplanar, multiecho pulse sequences of the brain and surrounding structures were obtained without intravenous contrast. Angiographic images of the head were obtained using MRA technique without contrast. COMPARISON:  Prior CT from earlier the same day. FINDINGS: MRI HEAD FINDINGS Mild diffuse cerebral atrophy present. Patchy T2/FLAIR hyperintensity within the periventricular, deep, and subcortical white matter both cerebral hemispheres, nonspecific, but most likely related to mild chronic small vessel ischemic disease. No abnormal  foci of restricted diffusion to suggest acute infarct. Gray-white matter differentiation maintained. Major intracranial vascular flow voids preserved. No acute or chronic intracranial hemorrhage. No mass lesion, midline shift, or mass effect. No hydrocephalus. No extra-axial fluid collection. Major dural sinuses are patent. Craniocervical junction within normal limits. Visualized upper cervical spine unremarkable. Pituitary gland normal.  No acute abnormality about the orbits. Paranasal sinuses are clear. No mastoid effusion. Inner ear structures normal. Bone marrow signal intensity within normal limits. No scalp soft tissue abnormality. MRA HEAD FINDINGS Visualized distal cervical segments of the internal carotid arteries are patent with antegrade flow. Petrous, cavernous, supraclinoid segments widely patent without stenosis. A1 segments, anterior communicating artery common anterior cerebral arteries well opacified. M1 segments patent without stenosis or occlusion. MCA bifurcations normal. Distal MCA branches fairly symmetric and well opacified. Evaluation of the distal MCA branches somewhat limited on 3D reconstructions due to technique. Vertebral arteries patent to the vertebrobasilar junction. Posterior inferior cerebral arteries patent bilaterally. Basilar artery widely patent. Superior cerebellar and posterior cerebral arteries well opacified bilaterally. No aneurysm or vascular malformation. IMPRESSION:  MRI HEAD IMPRESSION: 1. No acute intracranial process identified. 2. Mild atrophy with chronic small vessel ischemic disease. MRA HEAD IMPRESSION: Normal intracranial MRA. Electronically Signed   By: Rise Mu M.D.   On: 07/30/2015 01:06   Mr Maxine Glenn Head/brain Wo Cm  07/30/2015  CLINICAL DATA:  Initial evaluation for acute syncope, left-sided weakness upon awakening. EXAM: MRI HEAD WITHOUT CONTRAST MRA HEAD WITHOUT CONTRAST TECHNIQUE: Multiplanar, multiecho pulse sequences of the brain and  surrounding structures were obtained without intravenous contrast. Angiographic images of the head were obtained using MRA technique without contrast. COMPARISON:  Prior CT from earlier the same day. FINDINGS: MRI HEAD FINDINGS Mild diffuse cerebral atrophy present. Patchy T2/FLAIR hyperintensity within the periventricular, deep, and subcortical white matter both cerebral hemispheres, nonspecific, but most likely related to mild chronic small vessel ischemic disease. No abnormal foci of restricted diffusion to suggest acute infarct. Gray-white matter differentiation maintained. Major intracranial vascular flow voids preserved. No acute or chronic intracranial hemorrhage. No mass lesion, midline shift, or mass effect. No hydrocephalus. No extra-axial fluid collection. Major dural sinuses are patent. Craniocervical junction within normal limits. Visualized upper cervical spine unremarkable. Pituitary gland normal.  No acute abnormality about the orbits. Paranasal sinuses are clear. No mastoid effusion. Inner ear structures normal. Bone marrow signal intensity within normal limits. No scalp soft tissue abnormality. MRA HEAD FINDINGS Visualized distal cervical segments of the internal carotid arteries are patent with antegrade flow. Petrous, cavernous, supraclinoid segments widely patent without stenosis. A1 segments, anterior communicating artery common anterior cerebral arteries well opacified. M1 segments patent without stenosis or occlusion. MCA bifurcations normal. Distal MCA branches fairly symmetric and well opacified. Evaluation of the distal MCA branches somewhat limited on 3D reconstructions due to technique. Vertebral arteries patent to the vertebrobasilar junction. Posterior inferior cerebral arteries patent bilaterally. Basilar artery widely patent. Superior cerebellar and posterior cerebral arteries well opacified bilaterally. No aneurysm or vascular malformation. IMPRESSION: MRI HEAD IMPRESSION: 1. No  acute intracranial process identified. 2. Mild atrophy with chronic small vessel ischemic disease. MRA HEAD IMPRESSION: Normal intracranial MRA. Electronically Signed   By: Rise Mu M.D.   On: 07/30/2015 01:06        Scheduled Meds: . aspirin  325 mg Oral Daily  . atorvastatin  10 mg Oral q1800  . enoxaparin (LOVENOX) injection  40 mg Subcutaneous Q24H   Continuous Infusions:    LOS: 1 day    Time spent: 40 minutes    Daney Moor, MD [facility] Pager [phone]   If 7PM-7AM, please contact night-coverage [email or web address] Password [email or web address] 07/30/2015, 7:10 PM

## 2015-07-30 NOTE — Progress Notes (Signed)
  Echocardiogram 2D Echocardiogram has been performed.  Corey Fisher 07/30/2015, 11:07 AM

## 2015-07-31 DIAGNOSIS — G459 Transient cerebral ischemic attack, unspecified: Principal | ICD-10-CM

## 2015-07-31 DIAGNOSIS — I638 Other cerebral infarction: Secondary | ICD-10-CM | POA: Diagnosis not present

## 2015-07-31 LAB — HEMOGLOBIN A1C
Hgb A1c MFr Bld: 6.1 % — ABNORMAL HIGH (ref 4.8–5.6)
MEAN PLASMA GLUCOSE: 128 mg/dL

## 2015-07-31 MED ORDER — ASPIRIN 325 MG PO TABS: 325.0000 mg | ORAL_TABLET | Freq: Every day | ORAL | Status: AC

## 2015-07-31 MED ORDER — ATORVASTATIN CALCIUM 10 MG PO TABS: 10.0000 mg | ORAL_TABLET | Freq: Every day | ORAL | Status: AC

## 2015-07-31 NOTE — Care Management Note (Addendum)
Case Management Note  Patient Details  Name: WILBUR OAKLAND MRN: [id number] Date of Birth: 01-22-1951  Subjective/Objective:                    Action/Plan: Patient discharging home with self care. CM consulted for patient without PCP. CM met with the patient and provided the [facility] number for assistance in finding a MD taking patients in his area. Information also explained to his sister at the bedside.  Bedside RN updated.   Expected Discharge Date:  07/30/15               Expected Discharge Plan:  Home/Self Care  In-House Referral:     Discharge planning Services  CM Consult  Post Acute Care Choice:    Choice offered to:     DME Arranged:    DME Agency:     HH Arranged:    HH Agency:     Status of Service:  Completed, signed off  Medicare Important Message Given:    Date Medicare IM Given:    Medicare IM give by:    Date Additional Medicare IM Given:    Additional Medicare Important Message give by:     If discussed at [place] of Stay Meetings, dates discussed:    Additional Comments:  Pollie Friar, RN 07/31/2015, 12:52 PM

## 2015-07-31 NOTE — Procedures (Signed)
History: Corey Fisher is an 65 y.o. male patient with altered mental status. Routine inpatient EEG was performed for further evaluation.   Patient Active Problem List   Diagnosis Date Noted  . Stroke (HCC) 07/29/2015  . TIA (transient ischemic attack) 07/29/2015  . Syncope and collapse 07/29/2015     Current facility-administered medications:  .  aspirin tablet 325 mg, 325 mg, Oral, Daily, Ron ParkerHarvette C Jenkins, MD, 325 mg at 07/31/15 1008 .  atorvastatin (LIPITOR) tablet 10 mg, 10 mg, Oral, q1800, Ron ParkerHarvette C Jenkins, MD, 10 mg at 07/30/15 1732 .  enoxaparin (LOVENOX) injection 40 mg, 40 mg, Subcutaneous, Q24H, Ron ParkerHarvette C Jenkins, MD, 40 mg at 07/30/15 2108 .  senna-docusate (Senokot-S) tablet 1 tablet, 1 tablet, Oral, QHS PRN, Ron ParkerHarvette C Jenkins, MD   Introduction:  This is a 19 channel routine scalp EEG performed at the bedside with bipolar and monopolar montages arranged in accordance to the international 10/20 system of electrode placement. One channel was dedicated to EKG recording.   Findings:  The background rhythm was normal 9-10 Hz alpha . No definite evidence of abnormal epileptiform discharges or electrographic seizures were noted during this recording.   Impression:  Unremarkable awake and drowsy routine inpatient EEG. Clinical correlation is recommended .

## 2015-07-31 NOTE — Progress Notes (Signed)
OT Cancellation Note  Patient Details Name: Corey PattyHerman V Baena MRN: [id number] DOB: 05/02/1950   Cancelled Treatment:    Reason Eval/Treat Not Completed: OT screened, no needs identified, will sign off. Pt reports he feels like he has returned to baseline; no sensation deficits, UE weakness, or vision changes. Educated pt on signs and symptoms of stroke. No acute OT needs identified; signing off at this time. Please re-consult if needs change. Thank you for this referral.  Gaye AlkenBailey A Erian Lariviere M.S., OTR/L Pager: [phone]  07/31/2015, 7:58 AM

## 2015-07-31 NOTE — Progress Notes (Signed)
STROKE TEAM PROGRESS NOTE   HISTORY OF PRESENT ILLNESS Corey Fisher is a 65 y.o. male with no past medical hx he knows of. He does not take any meds. He tells me that Sunday night (07/28/2015) sometime before dinner he was walking and without warning passed out. He took approx 30 seconds to wake up on the floor. No tongue biting or urinary incontinence. No shaking or jerking. No hx of seizures. No prior dx of syncope. Around 9pm Sunday night started to have left arm numbness and left leg weakness. He believes the left leg weakness is gone but the left arm numbness persists to some extent. Neurology is consulted for these symptoms. MRI brain is negative. MRA brain is also negative. He has had similar sx once within last year. ROS was otherwise negative. Patient was not administered IV t-PA. He was admitted for further evaluation and treatment.   SUBJECTIVE (INTERVAL HISTORY) No one is at the bedside.  Overall he feels his condition is stable. No new complaints. Wants to go home.  OBJECTIVE Temp:  [97.6 F (36.4 C)-98.4 F (36.9 C)] 97.8 F (36.6 C) (04/19 0939) Pulse Rate:  [67-72] 70 (04/19 0939) Cardiac Rhythm:  [-] Normal sinus rhythm (04/19 0700) Resp:  [16-20] 20 (04/19 0939) BP: (132-137)/(82-89) 132/83 mmHg (04/19 0939) SpO2:  [95 %-99 %] 96 % (04/19 0939)  CBC:   Recent Labs Lab 07/29/15 1628  WBC 6.6  NEUTROABS 4.1  HGB 15.3  HCT 44.0  MCV 84.5  PLT 205    Basic Metabolic Panel:   Recent Labs Lab 07/29/15 1628  NA 137  K 4.2  CL 103  CO2 25  GLUCOSE 111*  BUN 13  CREATININE 1.05  CALCIUM 9.5    Lipid Panel:     Component Value Date/Time   CHOL 142 07/30/2015 0654   TRIG 88 07/30/2015 0654   HDL 48 07/30/2015 0654   CHOLHDL 3.0 07/30/2015 0654   VLDL 18 07/30/2015 0654   LDLCALC 76 07/30/2015 0654   HgbA1c:  Lab Results  Component Value Date   HGBA1C 6.1* 07/30/2015   Urine Drug Screen:     Component Value Date/Time   LABOPIA NONE  DETECTED 07/29/2015 2149   COCAINSCRNUR NONE DETECTED 07/29/2015 2149   LABBENZ NONE DETECTED 07/29/2015 2149   AMPHETMU NONE DETECTED 07/29/2015 2149   THCU NONE DETECTED 07/29/2015 2149   LABBARB NONE DETECTED 07/29/2015 2149      IMAGING  Ct Head Wo Contrast 07/29/2015  1.  No acute intracranial abnormality. 2.  Cerebral/cerebellar atrophy and small vessel ischemic change.   MRI HEAD  07/30/2015  1. No acute intracranial process identified. 2. Mild atrophy with chronic small vessel ischemic disease.   MRA HEAD  07/30/2015  Normal intracranial MRA.   Carotid Doppler   There is 1-39% bilateral ICA stenosis. Vertebral artery flow is antegrade.    PHYSICAL EXAM Pleasant middle aged male not in distress. . Afebrile. Head is nontraumatic. Neck is supple without bruit.    Cardiac exam no murmur or gallop. Lungs are clear to auscultation. Distal pulses are well felt. Neurological Exam ;  Awake  Alert oriented x 3. Normal speech and language.eye movements full without nystagmus.fundi were not visualized. Vision acuity and fields appear normal. Hearing is normal. Palatal movements are normal. Face symmetric. Tongue midline. Normal strength, tone, reflexes and coordination. Normal sensation. Gait deferred. ASSESSMENT/PLAN Mr. Corey Fisher is a 65 y.o. male with no significant past medical history presenting with loss  of consciousness followed by L arm numbness and L leg weakness, all resolved except L arm numbness. He did not receive IV t-PA.   R brain TIA in setting of a syncopal episode-likely small vessel disease  MRI  No acute stroke  MRA  unremarkable  Carotid Doppler  No significant stenosis   2D Echo  pending   EEG done, results pending   LDL 76  HgbA1c pending  Lovenox 40 mg sq daily for VTE prophylaxis Diet Heart Room service appropriate?: Yes; Fluid consistency:: Thin Diet - low sodium heart healthy  No antithrombotic prior to admission, now on aspirin 325 mg  daily  Patient counseled to be compliant with his antithrombotic medications  Ongoing aggressive stroke risk factor management  Therapy recommendations:  pending   Patient cannot drive x 6 months, patient informed.  Recommend cardiology eval of syncope  Disposition:  pending (anticipate return home)  Follow up with [facility] NP  Hypertension  Stable  Hyperlipidemia  Home meds:  No statin  LDL 76, goal < 70  Now on lipitor 10 mg daily  Continue statin at discharge  Other Stroke Risk Factors  Advanced age  Former Cigarette smoker  Current chewing tobacco user, advised to stop  ETOH use  Hospital day # 2   .Patient discharged after he she results. Follow-up as an outpatient in stroke clinic in 2 months. Delia HeadyPramod Ansen Sayegh, MD Medical Director [facility] Pager: [phone] 07/31/2015 3:21 PM    To contact Stroke Continuity provider, please refer to [email or web address]. After hours, contact General Neurology

## 2015-07-31 NOTE — Progress Notes (Signed)
Pt discharged at this time.  Walking out with sister.  He is in no distress.  Anxious for discharge.  Pt given discharge instructions and verbalizes understanding and follow-up appointments.  He has all of his belongings including his cell phone and charger, and has his prescription.

## 2015-08-01 NOTE — Discharge Summary (Signed)
Physician Discharge Summary  Corey Fisher ZOX:[id number] DOB: 11-Jul-1950 DOA: 07/29/2015  PCP: No primary care provider on file.  Admit date: 07/29/2015 Discharge date: 07/31/2015  Time spent: 30 minutes  Recommendations for Outpatient Follow-up:  Follow up with neurology as recommended.  Please follow up with cardiology for syncope work up.   Discharge Diagnoses:  Principal Problem:   Stroke [facility]) Active Problems:   TIA (transient ischemic attack)   Syncope and collapse   Discharge Condition: improved  Diet recommendation: low sodium diet.   Filed Weights   07/29/15 1602 07/29/15 2015  Weight: 81.647 kg (180 lb) 80.831 kg (178 lb 3.2 oz)    History of present illness:  Corey Fisher is a 65 y.o. male with no past medical hx he knows of presents with a syncopal episode.  He was admitted for evaluation of TIA/ stroke eval.   Hospital Course:  Syncope : Admitted to telemetry for evaluation of CVA.  MRI is negative for acute stroke.  EEg ordered and shows no definite evidence of abnormal epileptiform discharges. MRA unremarkable.  hgab1c is 6.1.  LDL is 76, recommended cardiology evaluation, pt wanted to get the eval done as outpatient.    Hypertension: Controlled.    Hyperlipidemia Continue lipitor.   Back pain : Resolved.  Procedures:  MRI brain  EEG  Consultations:  NEUROLOGY  Discharge Exam: Filed Vitals:   07/31/15 0542 07/31/15 0939  BP: 137/82 132/83  Pulse: 67 70  Temp: 97.6 F (36.4 C) 97.8 F (36.6 C)  Resp: 18 20    General: alert comfortable.  Cardiovascular: s1s2 Respiratory: ctab  Discharge Instructions   Discharge Instructions    Ambulatory referral to Neurology    Complete by:  As directed   Please schedule post stroke follow up in 2 months.     Diet - low sodium heart healthy    Complete by:  As directed      Discharge instructions    Complete by:  As directed   Follow up with PCP in one week.  Follow up  with neurology as recommended.  Please follow up with cardiology in one week for evaluation of syncope.          Discharge Medication List as of 07/31/2015  1:42 PM    START taking these medications   Details  aspirin 325 MG tablet Take 1 tablet (325 mg total) by mouth daily., Starting 07/31/2015, Until Discontinued, Print    atorvastatin (LIPITOR) 10 MG tablet Take 1 tablet (10 mg total) by mouth daily at 6 PM., Starting 07/31/2015, Until Discontinued, Print       No Known Allergies Follow-up Information    Follow up with SETHI,PRAMOD, MD In 2 months.   Specialties:  Neurology, Radiology   Why:  Stroke Clinic, Office will call you with appointment date & time   Contact information:   [address] [place] [address] [phone]       Follow up with [facility]. Schedule an appointment as soon as possible for a visit in 1 week.   Specialty:  Cardiology   Why:  needs evaluation for syncope.    Contact information:   [address] [place] [address] [phone]       The results of significant diagnostics from this hospitalization (including imaging, microbiology, ancillary and laboratory) are listed below for reference.    Significant Diagnostic Studies: Ct Head Wo Contrast  07/29/2015  CLINICAL  DATA:  Syncopal episode yesterday.  Right arm numbness. EXAM: CT HEAD WITHOUT CONTRAST TECHNIQUE: Contiguous axial images were obtained from the base of the skull through the vertex without intravenous contrast. COMPARISON:  None. FINDINGS: Sinuses/Soft tissues: Clear paranasal sinuses and mastoid air cells. Intracranial: Mild low density in the periventricular white matter likely related to small vessel disease. Mild cerebral and cerebellar atrophy. No mass lesion, hemorrhage, hydrocephalus, acute infarct, intra-axial, or extra-axial fluid collection. IMPRESSION: 1.  No acute intracranial abnormality. 2.   Cerebral/cerebellar atrophy and small vessel ischemic change. Electronically Signed   By: Jeronimo Greaves M.D.   On: 07/29/2015 16:47   Mr Brain Wo Contrast  07/30/2015  CLINICAL DATA:  Initial evaluation for acute syncope, left-sided weakness upon awakening. EXAM: MRI HEAD WITHOUT CONTRAST MRA HEAD WITHOUT CONTRAST TECHNIQUE: Multiplanar, multiecho pulse sequences of the brain and surrounding structures were obtained without intravenous contrast. Angiographic images of the head were obtained using MRA technique without contrast. COMPARISON:  Prior CT from earlier the same day. FINDINGS: MRI HEAD FINDINGS Mild diffuse cerebral atrophy present. Patchy T2/FLAIR hyperintensity within the periventricular, deep, and subcortical white matter both cerebral hemispheres, nonspecific, but most likely related to mild chronic small vessel ischemic disease. No abnormal foci of restricted diffusion to suggest acute infarct. Gray-white matter differentiation maintained. Major intracranial vascular flow voids preserved. No acute or chronic intracranial hemorrhage. No mass lesion, midline shift, or mass effect. No hydrocephalus. No extra-axial fluid collection. Major dural sinuses are patent. Craniocervical junction within normal limits. Visualized upper cervical spine unremarkable. Pituitary gland normal.  No acute abnormality about the orbits. Paranasal sinuses are clear. No mastoid effusion. Inner ear structures normal. Bone marrow signal intensity within normal limits. No scalp soft tissue abnormality. MRA HEAD FINDINGS Visualized distal cervical segments of the internal carotid arteries are patent with antegrade flow. Petrous, cavernous, supraclinoid segments widely patent without stenosis. A1 segments, anterior communicating artery common anterior cerebral arteries well opacified. M1 segments patent without stenosis or occlusion. MCA bifurcations normal. Distal MCA branches fairly symmetric and well opacified. Evaluation of  the distal MCA branches somewhat limited on 3D reconstructions due to technique. Vertebral arteries patent to the vertebrobasilar junction. Posterior inferior cerebral arteries patent bilaterally. Basilar artery widely patent. Superior cerebellar and posterior cerebral arteries well opacified bilaterally. No aneurysm or vascular malformation. IMPRESSION: MRI HEAD IMPRESSION: 1. No acute intracranial process identified. 2. Mild atrophy with chronic small vessel ischemic disease. MRA HEAD IMPRESSION: Normal intracranial MRA. Electronically Signed   By: Rise Mu M.D.   On: 07/30/2015 01:06   Mr Maxine Glenn Head/brain Wo Cm  07/30/2015  CLINICAL DATA:  Initial evaluation for acute syncope, left-sided weakness upon awakening. EXAM: MRI HEAD WITHOUT CONTRAST MRA HEAD WITHOUT CONTRAST TECHNIQUE: Multiplanar, multiecho pulse sequences of the brain and surrounding structures were obtained without intravenous contrast. Angiographic images of the head were obtained using MRA technique without contrast. COMPARISON:  Prior CT from earlier the same day. FINDINGS: MRI HEAD FINDINGS Mild diffuse cerebral atrophy present. Patchy T2/FLAIR hyperintensity within the periventricular, deep, and subcortical white matter both cerebral hemispheres, nonspecific, but most likely related to mild chronic small vessel ischemic disease. No abnormal foci of restricted diffusion to suggest acute infarct. Gray-white matter differentiation maintained. Major intracranial vascular flow voids preserved. No acute or chronic intracranial hemorrhage. No mass lesion, midline shift, or mass effect. No hydrocephalus. No extra-axial fluid collection. Major dural sinuses are patent. Craniocervical junction within normal limits. Visualized upper cervical spine unremarkable. Pituitary gland normal.  No  acute abnormality about the orbits. Paranasal sinuses are clear. No mastoid effusion. Inner ear structures normal. Bone marrow signal intensity within  normal limits. No scalp soft tissue abnormality. MRA HEAD FINDINGS Visualized distal cervical segments of the internal carotid arteries are patent with antegrade flow. Petrous, cavernous, supraclinoid segments widely patent without stenosis. A1 segments, anterior communicating artery common anterior cerebral arteries well opacified. M1 segments patent without stenosis or occlusion. MCA bifurcations normal. Distal MCA branches fairly symmetric and well opacified. Evaluation of the distal MCA branches somewhat limited on 3D reconstructions due to technique. Vertebral arteries patent to the vertebrobasilar junction. Posterior inferior cerebral arteries patent bilaterally. Basilar artery widely patent. Superior cerebellar and posterior cerebral arteries well opacified bilaterally. No aneurysm or vascular malformation. IMPRESSION: MRI HEAD IMPRESSION: 1. No acute intracranial process identified. 2. Mild atrophy with chronic small vessel ischemic disease. MRA HEAD IMPRESSION: Normal intracranial MRA. Electronically Signed   By: Rise MuBenjamin  McClintock M.D.   On: 07/30/2015 01:06    Microbiology: No results found for this or any previous visit (from the past 240 hour(s)).   Labs: Basic Metabolic Panel:  Recent Labs Lab 07/29/15 1628  NA 137  K 4.2  CL 103  CO2 25  GLUCOSE 111*  BUN 13  CREATININE 1.05  CALCIUM 9.5   Liver Function Tests:  Recent Labs Lab 07/29/15 1628  AST 35  ALT 37  ALKPHOS 51  BILITOT 0.5  PROT 8.3*  ALBUMIN 4.5   No results for input(s): LIPASE, AMYLASE in the last 168 hours. No results for input(s): AMMONIA in the last 168 hours. CBC:  Recent Labs Lab 07/29/15 1628  WBC 6.6  NEUTROABS 4.1  HGB 15.3  HCT 44.0  MCV 84.5  PLT 205   Cardiac Enzymes:  Recent Labs Lab 07/29/15 1628  TROPONINI <0.03   BNP: BNP (last 3 results) No results for input(s): BNP in the last 8760 hours.  ProBNP (last 3 results) No results for input(s): PROBNP in the last 8760  hours.  CBG: No results for input(s): GLUCAP in the last 168 hours.     SignedKathlen Mody:  Allen Egerton MD.  [facility] 08/01/2015, 9:13 AM

## 2015-10-14 ENCOUNTER — Ambulatory Visit: Payer: No Typology Code available for payment source | Admitting: Neurology

## 2015-10-16 ENCOUNTER — Encounter: Payer: Self-pay | Admitting: Neurology

## 2017-08-12 IMAGING — MR MR HEAD W/O CM
9 of 12 series · 31 of 48 positions shown · non-contrast
Comparison: Prior CT from earlier the same day.

CLINICAL DATA: Initial evaluation for acute syncope, left-sided
weakness upon awakening.

EXAM:
MRI HEAD WITHOUT CONTRAST
MRA HEAD WITHOUT CONTRAST
TECHNIQUE: Multiplanar, multiecho pulse sequences of the brain and surrounding
structures were obtained without intravenous contrast. Angiographic
images of the head were obtained using MRA technique without
contrast.

[Series 3: T1 · sagittal · 5.0mm · 0.47mm/px · 1 of 23 slices shown]
[im 1/23]
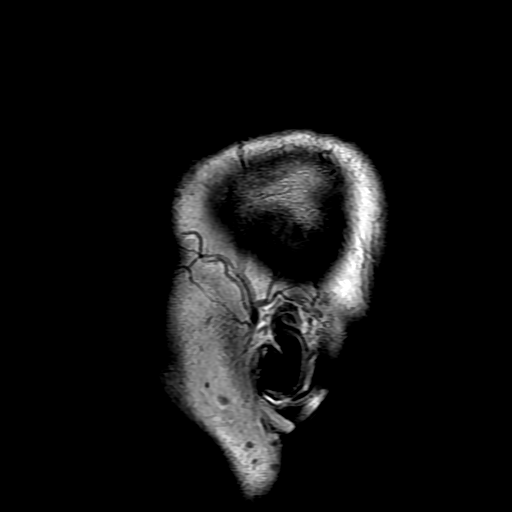

[Series 4: DWI · axial · 3.0mm · 1.09mm/px · z∈[-49,+95]mm · 6 of 98 slices shown (1 of 4)]
[im 1/98]
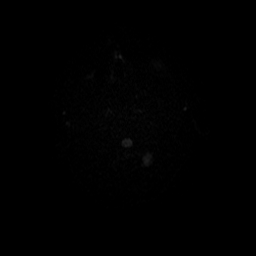
[im 20/98]
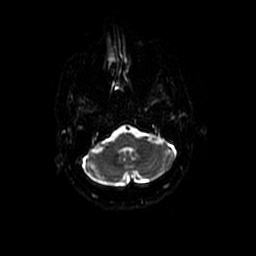
[im 39/98]
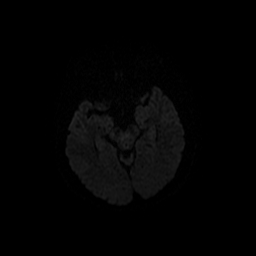
[im 59/98]
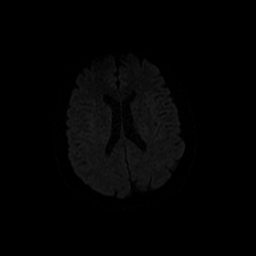
[im 78/98]
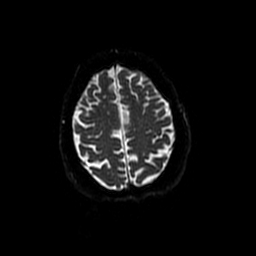
[im 98/98]
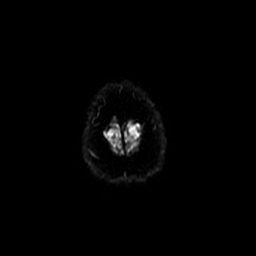

[Series 5: (id) mt fs · axial · 1.4mm · 0.43mm/px · z∈[-44,+33]mm · 6 of 154 slices shown]
[im 1/154]
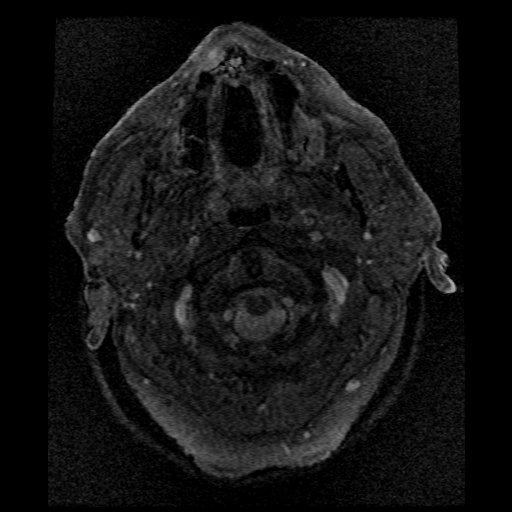
[im 28/154]
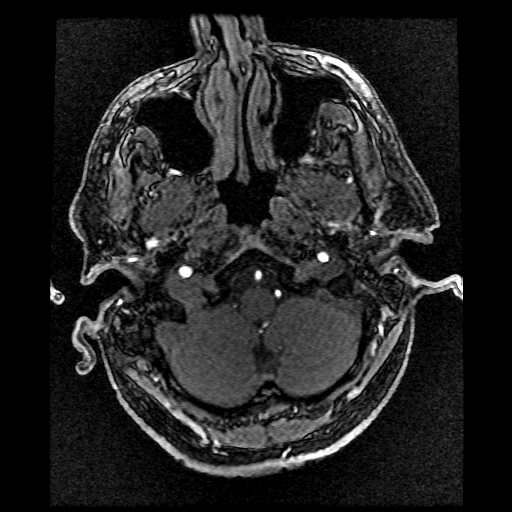
[im 42/154]
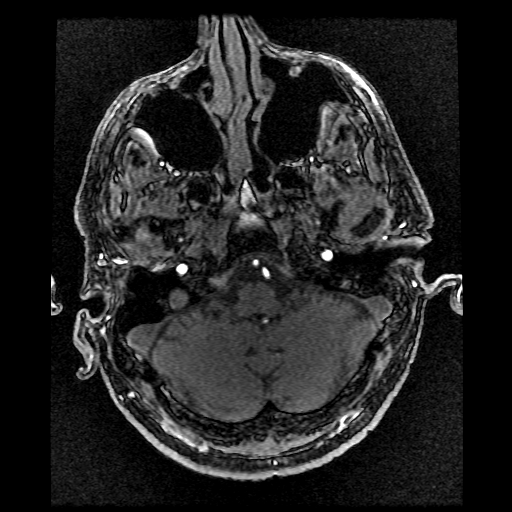
[im 70/154]
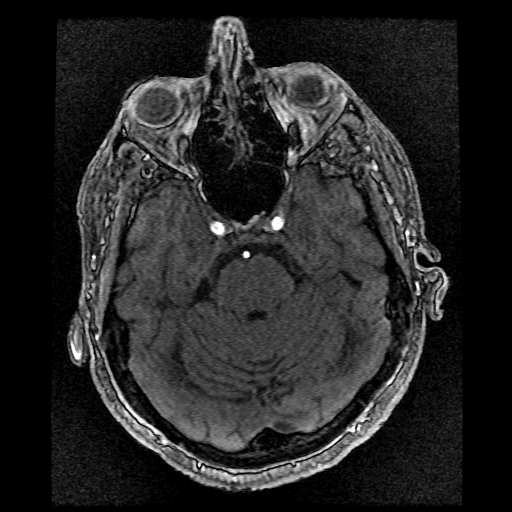
[im 84/154]
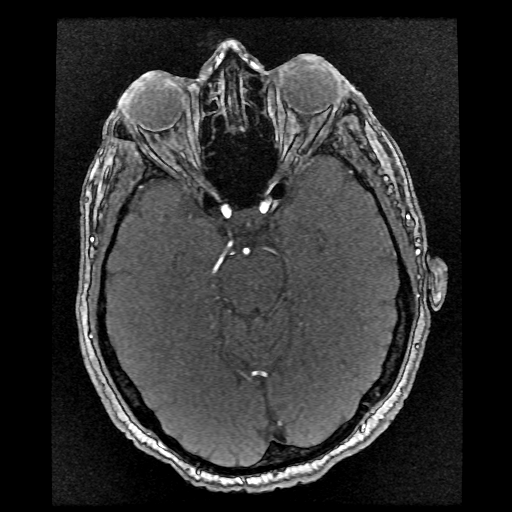
[im 112/154]
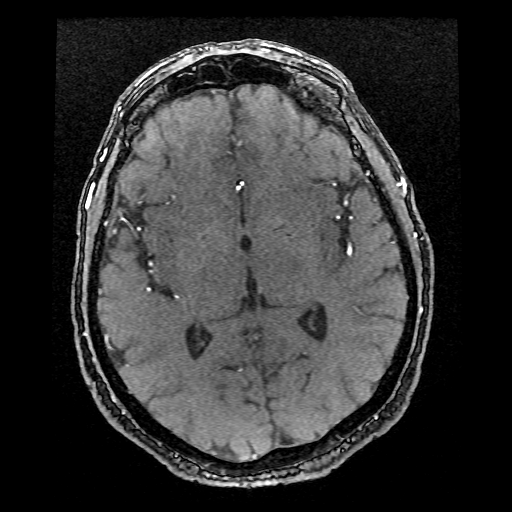

[Series 6: T2 · axial · 5.0mm · 0.45mm/px · z∈[-50,+100]mm · 2 of 26 slices shown (1 of 2)]
[im 1/26]
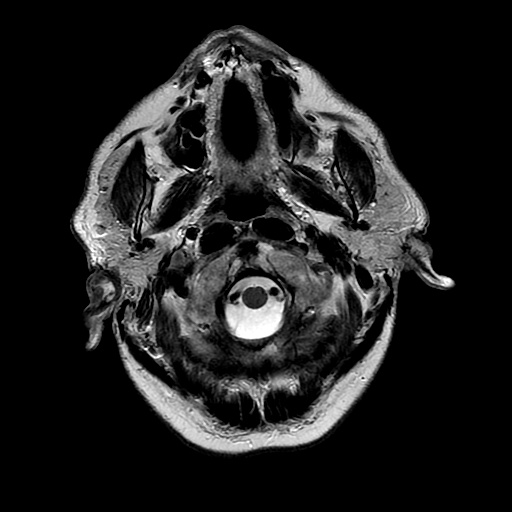
[im 26/26]
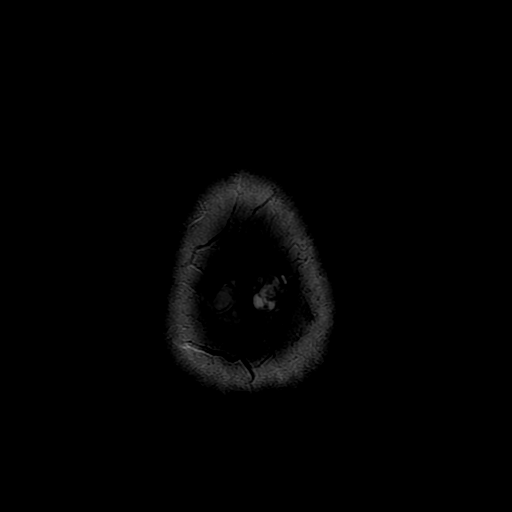

[Series 7: DWI · coronal · 5.0mm · 1.09mm/px · 5 of 66 slices shown (2 of 4)]
[im 1/66]
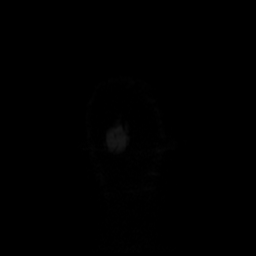
[im 17/66]
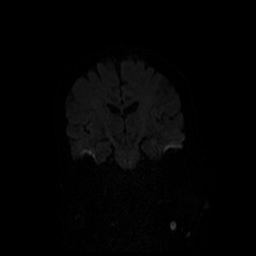
[im 33/66]
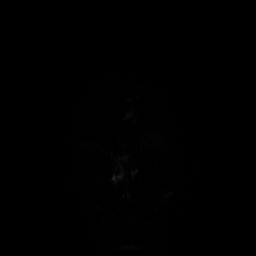
[im 49/66]
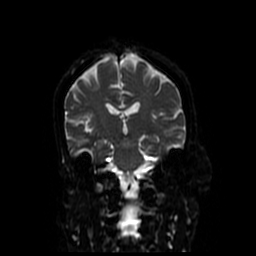
[im 66/66]
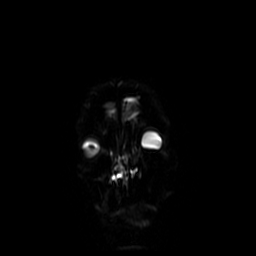

[Series 8: FLAIR · axial · 5.0mm · 0.45mm/px · z∈[-50,+100]mm · 2 of 26 slices shown]
[im 1/26]
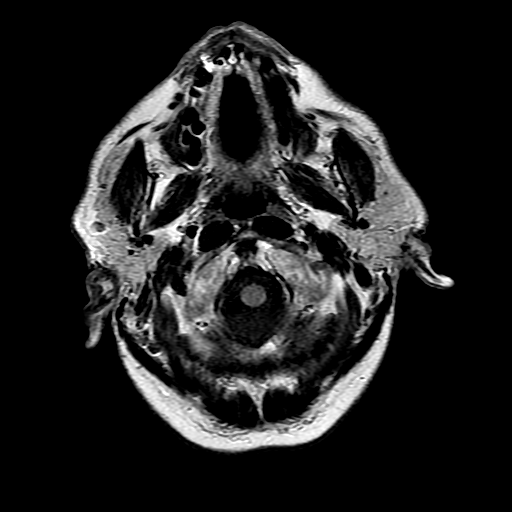
[im 26/26]
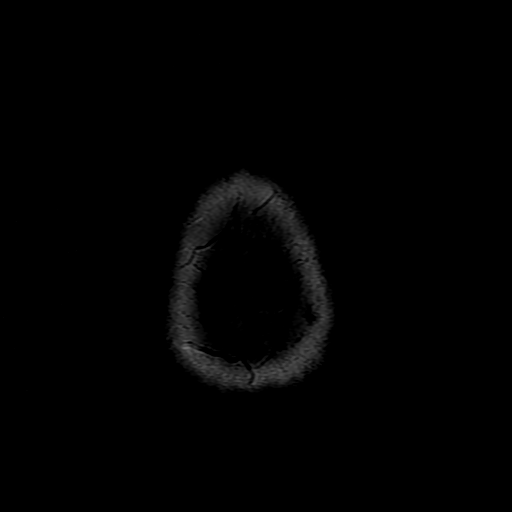

[Series 11: T2 · coronal · 5.0mm · 0.41mm/px · 2 of 25 slices shown (2 of 2)]
[im 1/25]
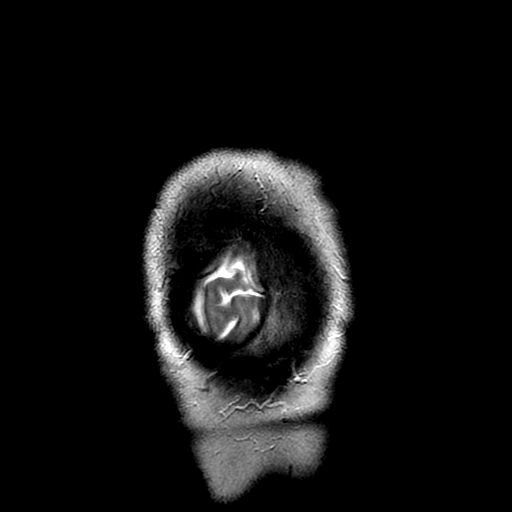
[im 25/25]
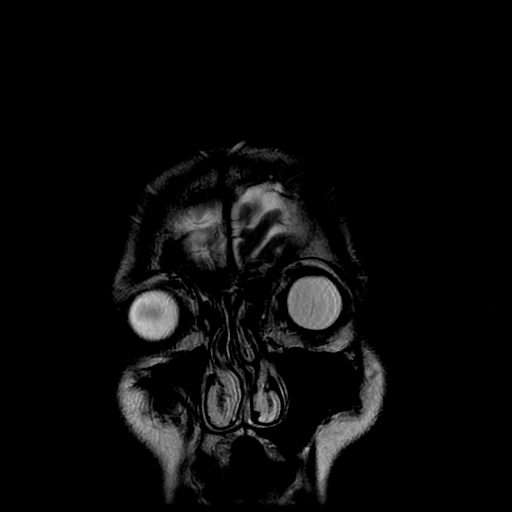

[Series 400: DWI · axial · 3.0mm · 1.09mm/px · z∈[-49,+95]mm · 4 of 49 slices shown (3 of 4)]
[im 1/49]
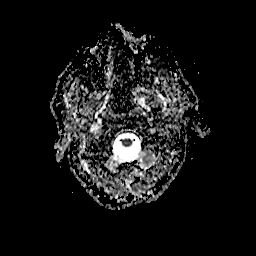
[im 17/49]
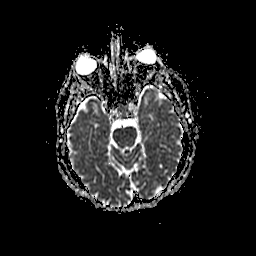
[im 33/49]
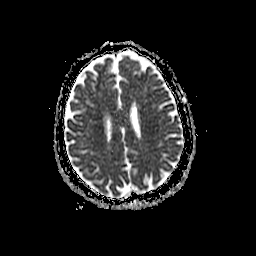
[im 49/49]
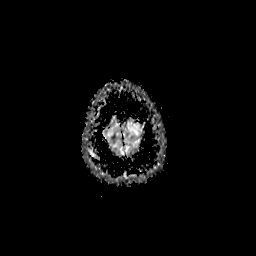

[Series 700: DWI · coronal · 5.0mm · 1.09mm/px · 3 of 33 slices shown (4 of 4)]
[im 1/33]
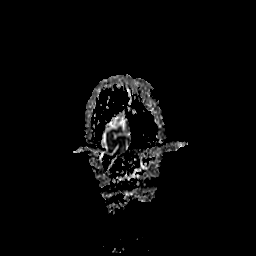
[im 17/33]
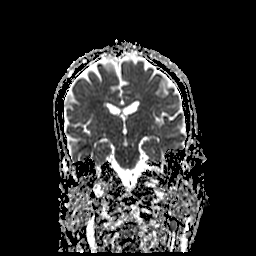
[im 33/33]
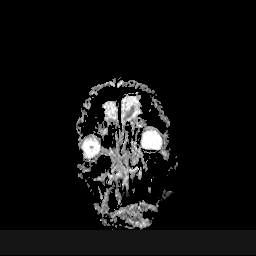

[31 of 48 positions shown; findings below may reference images not displayed]

FINDINGS: MRI HEAD FINDINGS

Mild diffuse cerebral atrophy present. Patchy T2/FLAIR
hyperintensity within the periventricular, deep, and subcortical
white matter both cerebral hemispheres, nonspecific, but most likely
related to mild chronic small vessel ischemic disease.

No abnormal foci of restricted diffusion to suggest acute infarct.
Gray-white matter differentiation maintained. Major intracranial
vascular flow voids preserved. No acute or chronic intracranial
hemorrhage.

No mass lesion, midline shift, or mass effect. No hydrocephalus. No
extra-axial fluid collection. Major dural sinuses are patent.

Craniocervical junction within normal limits. Visualized upper
cervical spine unremarkable.

Pituitary gland normal.  No acute abnormality about the orbits.

Paranasal sinuses are clear. No mastoid effusion. Inner ear
structures normal.

Bone marrow signal intensity within normal limits. No scalp soft
tissue abnormality.

MRA HEAD FINDINGS

Visualized distal cervical segments of the internal carotid arteries
are patent with antegrade flow. Petrous, cavernous, supraclinoid
segments widely patent without stenosis. A1 segments, anterior
communicating artery common anterior cerebral arteries well
opacified. M1 segments patent without stenosis or occlusion. MCA
bifurcations normal. Distal MCA branches fairly symmetric and well
opacified. Evaluation of the distal MCA branches somewhat limited on
3D reconstructions due to technique.

Vertebral arteries patent to the vertebrobasilar junction. Posterior
inferior cerebral arteries patent bilaterally. Basilar artery widely
patent. Superior cerebellar and posterior cerebral arteries well
opacified bilaterally.

No aneurysm or vascular malformation.
IMPRESSION: MRI HEAD IMPRESSION:

1. No acute intracranial process identified.
2. Mild atrophy with chronic small vessel ischemic disease.
MRA HEAD IMPRESSION:

Normal intracranial MRA.

## 2020-04-24 ENCOUNTER — Other Ambulatory Visit (HOSPITAL_BASED_OUTPATIENT_CLINIC_OR_DEPARTMENT_OTHER): Payer: Self-pay | Admitting: Emergency Medicine

## 2020-04-24 ENCOUNTER — Emergency Department (HOSPITAL_BASED_OUTPATIENT_CLINIC_OR_DEPARTMENT_OTHER)
Admission: EM | Admit: 2020-04-24 | Discharge: 2020-04-24 | Disposition: A | Discharge status: Home / Self Care | Payer: No Typology Code available for payment source | Attending: Emergency Medicine | Admitting: Emergency Medicine

## 2020-04-24 ENCOUNTER — Other Ambulatory Visit: Payer: Self-pay

## 2020-04-24 ENCOUNTER — Encounter (HOSPITAL_BASED_OUTPATIENT_CLINIC_OR_DEPARTMENT_OTHER): Payer: Self-pay

## 2020-04-24 DIAGNOSIS — R251 Tremor, unspecified: Secondary | ICD-10-CM | POA: Insufficient documentation

## 2020-04-24 DIAGNOSIS — Z8673 Personal history of transient ischemic attack (TIA), and cerebral infarction without residual deficits: Secondary | ICD-10-CM | POA: Insufficient documentation

## 2020-04-24 DIAGNOSIS — F10239 Alcohol dependence with withdrawal, unspecified: Secondary | ICD-10-CM | POA: Diagnosis not present

## 2020-04-24 DIAGNOSIS — F10930 Alcohol use, unspecified with withdrawal, uncomplicated: Secondary | ICD-10-CM

## 2020-04-24 DIAGNOSIS — Z87891 Personal history of nicotine dependence: Secondary | ICD-10-CM | POA: Diagnosis not present

## 2020-04-24 DIAGNOSIS — F10230 Alcohol dependence with withdrawal, uncomplicated: Secondary | ICD-10-CM

## 2020-04-24 LAB — CBC
HCT: 43.3 % (ref 39.0–52.0)
Hemoglobin: 14.5 g/dL (ref 13.0–17.0)
MCH: 29.6 pg (ref 26.0–34.0)
MCHC: 33.5 g/dL (ref 30.0–36.0)
MCV: 88.4 fL (ref 80.0–100.0)
Platelets: 202 10*3/uL (ref 150–400)
RBC: 4.9 MIL/uL (ref 4.22–5.81)
RDW: 13.4 % (ref 11.5–15.5)
WBC: 6.3 10*3/uL (ref 4.0–10.5)
nRBC: 0 % (ref 0.0–0.2)

## 2020-04-24 LAB — BASIC METABOLIC PANEL
Anion gap: 13 (ref 5–15)
BUN: 7 mg/dL — ABNORMAL LOW (ref 8–23)
CO2: 25 mmol/L (ref 22–32)
Calcium: 9.4 mg/dL (ref 8.9–10.3)
Chloride: 98 mmol/L (ref 98–111)
Creatinine, Ser: 0.94 mg/dL (ref 0.61–1.24)
GFR, Estimated: >60 mL/min (ref >60)
Glucose, Bld: 119 mg/dL — ABNORMAL HIGH (ref 70–99)
Potassium: 4.1 mmol/L (ref 3.5–5.1)
Sodium: 136 mmol/L (ref 135–145)

## 2020-04-24 LAB — MAGNESIUM: Magnesium: 1.9 mg/dL (ref 1.7–2.4)

## 2020-04-24 MED ORDER — CHLORDIAZEPOXIDE HCL 25 MG PO CAPS
50.0000 mg | ORAL_CAPSULE | Freq: Once | ORAL | Status: AC
  Administered 2020-04-24: 50 mg via ORAL
  Filled 2020-04-24: qty 2

## 2020-04-24 MED ORDER — SODIUM CHLORIDE 0.9 % IV BOLUS
1000.0000 mL | Freq: Once | INTRAVENOUS | Status: AC
  Administered 2020-04-24: 1000 mL via INTRAVENOUS

## 2020-04-24 MED ORDER — THIAMINE HCL 100 MG/ML IJ SOLN
Freq: Once | INTRAVENOUS | Status: DC
  Filled 2020-04-24: qty 1000

## 2020-04-24 MED ORDER — THIAMINE HCL 100 MG PO TABS: 100.0000 mg | ORAL_TABLET | Freq: Every day | ORAL | 1 refills | Status: AC

## 2020-04-24 MED ORDER — ADULT MULTIVITAMIN W/MINERALS CH
1.0000 | ORAL_TABLET | Freq: Once | ORAL | Status: AC
  Administered 2020-04-24: 1 via ORAL
  Filled 2020-04-24: qty 1

## 2020-04-24 MED ORDER — CHLORDIAZEPOXIDE HCL 25 MG PO CAPS: ORAL_CAPSULE | ORAL | 0 refills | Status: DC

## 2020-04-24 MED ORDER — FOLIC ACID 1 MG PO TABS
1.0000 mg | ORAL_TABLET | Freq: Once | ORAL | Status: AC
  Administered 2020-04-24: 1 mg via ORAL
  Filled 2020-04-24: qty 1

## 2020-04-24 MED ORDER — THIAMINE HCL 100 MG/ML IJ SOLN
100.0000 mg | Freq: Once | INTRAMUSCULAR | Status: AC
  Administered 2020-04-24: 100 mg via INTRAVENOUS
  Filled 2020-04-24: qty 2

## 2020-04-24 MED FILL — VITAMIN B-1 100 MG TABLET: 100 | 100 days supply | Qty: 100 | Fill #0

## 2020-04-24 MED FILL — CHLORDIAZEPOXIDE 25 MG CAPS: 25 | 3 days supply | Qty: 10 | Fill #0

## 2020-04-24 NOTE — ED Triage Notes (Addendum)
Patient reports recurrent episodes of vomiting and tremors over at least a year, patient states he drinks "a few beers a day, sometimes more than a few" and has recently "cut way back" on his alcohol intake.  Asked if he drinks in the morning when he wakes up patient stated "hardly ever"

## 2020-04-24 NOTE — ED Provider Notes (Signed)
[facility] EMERGENCY DEPARTMENT Provider Note   CSN: [id number] Arrival date & time: 04/24/20  1029     History Chief Complaint  Patient presents with  . Emesis  . Tremors    Possible ETOH withdrawal    Corey Fisher is a 70 y.o. male history of daily alcohol consumption present emergency department concern for alcohol withdrawal.  The patient reports that he drinks a couple beers today, usually about a sixpack.  He states his last drink was yesterday evening.  He says he been feeling shaky all morning.  He notes he has tremors in both of his hands.  He says he feels like he is withdrawing.  He says he has been drinking every day for at least a year, and longer.  He cannot recall the last period of time he was sober for several days.  He denies drinking any hard liquor.  He states he occasionally to drink alcohol the morning when he wakes up but "not every day".  He reports a distant history of seizures but cannot recall if these were related to alcohol withdrawal.  He has not had a seizure recently.  He denies to me that he has any medical problems and reports he does not take any medications.  He denies any drug allergies.  He lives by himself.  HPI     History reviewed. No pertinent past medical history.  Patient Active Problem List   Diagnosis Date Noted  . Stroke (HCC) 07/29/2015  . TIA (transient ischemic attack) 07/29/2015  . Syncope and collapse 07/29/2015    History reviewed. No pertinent surgical history.     History reviewed. No pertinent family history.  Social History   Tobacco Use  . Smoking status: Former Games developer  . Smokeless tobacco: Current User    Types: Chew  Substance Use Topics  . Alcohol use: Yes    Comment: patient states "a couple of beers daily, sometimes more than a couple", recently cut back on drinking per patient  . Drug use: No    Home Medications Prior to Admission medications   Medication Sig Start Date End Date  Taking? Authorizing Provider  chlordiazePOXIDE (LIBRIUM) 25 MG capsule 50mg  PO TID x 1D, then 25-50mg  PO BID X 1D, then 25-50mg  PO QD X 1D 04/24/20  Yes Jyra Lagares, 06/22/20, MD  thiamine 100 MG tablet Take 1 tablet (100 mg total) by mouth daily. 04/24/20 06/23/20 Yes Jazlyne Gauger, 06/25/20, MD  aspirin 325 MG tablet Take 1 tablet (325 mg total) by mouth daily. 07/31/15   08/02/15, MD  atorvastatin (LIPITOR) 10 MG tablet Take 1 tablet (10 mg total) by mouth daily at 6 PM. 07/31/15   08/02/15, MD    Allergies    Patient has no known allergies.  Review of Systems   Review of Systems  Constitutional: Negative for chills and fever.  HENT: Negative for ear pain and sore throat.   Eyes: Negative for pain and visual disturbance.  Respiratory: Negative for cough and shortness of breath.   Cardiovascular: Negative for chest pain and palpitations.  Gastrointestinal: Negative for abdominal pain and vomiting.  Genitourinary: Negative for dysuria and hematuria.  Musculoskeletal: Positive for arthralgias and myalgias.  Skin: Negative for color change and rash.  Neurological: Positive for weakness and numbness.  All other systems reviewed and are negative.   Physical Exam Updated Vital Signs BP (!) 165/100   Pulse 78   Temp 98.3 F (36.8 C) (Oral)   Resp  16   Ht 6\' 1"  (1.854 m)   Wt 81.6 kg   SpO2 98%   BMI 23.75 kg/m   Physical Exam Constitutional:      General: He is not in acute distress. HENT:     Head: Normocephalic and atraumatic.  Eyes:     Conjunctiva/sclera: Conjunctivae normal.     Pupils: Pupils are equal, round, and reactive to light.  Cardiovascular:     Rate and Rhythm: Normal rate and regular rhythm.  Pulmonary:     Effort: Pulmonary effort is normal. No respiratory distress.  Abdominal:     General: There is no distension.     Tenderness: There is no abdominal tenderness.  Skin:    General: Skin is warm and dry.  Neurological:     General: No focal deficit  present.     Mental Status: He is alert. Mental status is at baseline.     Comments: Bilateral hand tremors, tongue fasciculations  Psychiatric:        Mood and Affect: Mood normal.        Behavior: Behavior normal.     ED Results / Procedures / Treatments   Labs (all labs ordered are listed, but only abnormal results are displayed) Labs Reviewed  BASIC METABOLIC PANEL - Abnormal; Notable for the following components:      Result Value   Glucose, Bld 119 (*)    BUN 7 (*)    All other components within normal limits  MAGNESIUM  CBC    EKG None  Radiology No results found.  Procedures Procedures (including critical care time)  Medications Ordered in ED Medications  chlordiazePOXIDE (LIBRIUM) capsule 50 mg (50 mg Oral Given 04/24/20 1319)  thiamine (B-1) injection 100 mg (100 mg Intravenous Given 04/24/20 1336)  sodium chloride 0.9 % bolus 1,000 mL (0 mLs Intravenous Stopped 04/24/20 1411)  multivitamin with minerals tablet 1 tablet (1 tablet Oral Given 04/24/20 1336)  folic acid (FOLVITE) tablet 1 mg (1 mg Oral Given 04/24/20 1336)    ED Course  I have reviewed the triage vital signs and the nursing notes.  Pertinent labs & imaging results that were available during my care of the patient were reviewed by me and considered in my medical decision making (see chart for details).   25 male present emergency department suspected alcohol withdrawal.  He has tremors on exam.  No hallucinations.  CIWA score is low to moderate.  We can try treatment with p.o. Librium here.  We can also check his electrolytes, and we can give him IV saline and thiamine. I do think it has been a Librium taper at home.  He is interested in quitting drinking.  We will also provide him with outpatient resources for this.  Labs reviewed -unremarkable.  Electrolytes wnl On reassessment improvement of tremors, and he is feeling better. Okay to discharge  Final Clinical Impression(s) / ED Diagnoses Final  diagnoses:  Alcohol withdrawal syndrome without complication (HCC)    Rx / DC Orders ED Discharge Orders         Ordered    chlordiazePOXIDE (LIBRIUM) 25 MG capsule        04/24/20 1403    thiamine 100 MG tablet  Daily        04/24/20 1403           06/22/20, MD 04/24/20 1710
